# Patient Record
Sex: Female | Born: 1970 | Race: White | Hispanic: No | Marital: Married | Smoking: Never smoker
Health system: Southern US, Community
[De-identification: ages and names within clinical notes are randomized; demographics above are authoritative.]

## PROBLEM LIST (undated history)

## (undated) DIAGNOSIS — R079 Chest pain, unspecified: Secondary | ICD-10-CM

## (undated) DIAGNOSIS — R011 Cardiac murmur, unspecified: Secondary | ICD-10-CM

## (undated) DIAGNOSIS — B019 Varicella without complication: Secondary | ICD-10-CM

## (undated) DIAGNOSIS — E079 Disorder of thyroid, unspecified: Secondary | ICD-10-CM

## (undated) DIAGNOSIS — I10 Essential (primary) hypertension: Secondary | ICD-10-CM

## (undated) DIAGNOSIS — G43909 Migraine, unspecified, not intractable, without status migrainosus: Secondary | ICD-10-CM

## (undated) DIAGNOSIS — D649 Anemia, unspecified: Secondary | ICD-10-CM

## (undated) HISTORY — DX: Essential (primary) hypertension: I10

## (undated) HISTORY — DX: Chest pain, unspecified: R07.9

## (undated) HISTORY — DX: Disorder of thyroid, unspecified: E07.9

## (undated) HISTORY — DX: Cardiac murmur, unspecified: R01.1

## (undated) HISTORY — DX: Varicella without complication: B01.9

## (undated) HISTORY — DX: Migraine, unspecified, not intractable, without status migrainosus: G43.909

## (undated) HISTORY — DX: Anemia, unspecified: D64.9

---

## 1990-06-20 HISTORY — PX: CERVIX SURGERY: SHX593

## 2004-06-20 HISTORY — PX: CLAVICLE SURGERY: SHX598

## 2012-06-20 HISTORY — PX: DILATION AND CURETTAGE OF UTERUS: SHX78

## 2016-08-20 ENCOUNTER — Emergency Department (HOSPITAL_COMMUNITY)
Admission: EM | Admit: 2016-08-20 | Discharge: 2016-08-20 | Disposition: A | Discharge status: Home / Self Care | Payer: 59 | Attending: Emergency Medicine | Admitting: Emergency Medicine

## 2016-08-20 ENCOUNTER — Encounter (HOSPITAL_COMMUNITY): Payer: Self-pay | Admitting: *Deleted

## 2016-08-20 DIAGNOSIS — R109 Unspecified abdominal pain: Secondary | ICD-10-CM | POA: Diagnosis not present

## 2016-08-20 MED ORDER — METHOCARBAMOL 500 MG PO TABS: 500.0000 mg | ORAL_TABLET | Freq: Three times a day (TID) | ORAL | 0 refills | Status: DC | PRN

## 2016-08-20 NOTE — Discharge Instructions (Signed)
Your pain in likely in your chest wall. Take the muscle relaxers as needed.

## 2016-08-20 NOTE — ED Provider Notes (Signed)
[facility] DEPT Provider Note   CSN: [id number] Arrival date & time: 08/20/16  1314     History   Chief Complaint Chief Complaint  Patient presents with  . Back Pain    HPI Theresa Raymond is a 46 y.o. female.  HPI Patient sent from urgent care with left lower rib/back pain. Has had for last 2 days. No trauma. Worse with movements. Had urine at the urgent care that did not show infection or blood. Chest x-ray also did not show clear cause. No shortness of breath. No fevers. Sent in to the ER because the provider at the urgent care could palpate her aorta.   History reviewed. No pertinent past medical history.  There are no active problems to display for this patient.   Past Surgical History:  Procedure Laterality Date  . CLAVICLE SURGERY      OB History    No data available       Home Medications    Prior to Admission medications   Medication Sig Start Date End Date Taking? Authorizing Provider  methocarbamol (ROBAXIN) 500 MG tablet Take 1 tablet (500 mg total) by mouth every 8 (eight) hours as needed for muscle spasms. 08/20/16   Davonna Belling, MD    Family History No family history on file.  Social History Social History  Substance Use Topics  . Smoking status: Never Smoker  . Smokeless tobacco: Never Used  . Alcohol use 8.4 oz/week    14 Glasses of wine per week     Allergies   Patient has no allergy information on record.   Review of Systems Review of Systems  Constitutional: Negative for appetite change.  Respiratory: Negative for shortness of breath.   Cardiovascular: Negative for chest pain.  Gastrointestinal: Negative for abdominal pain.  Endocrine: Negative for polyuria.  Genitourinary: Positive for flank pain. Negative for dyspareunia and pelvic pain.  Musculoskeletal: Negative for back pain.  Neurological: Negative for seizures, speech difficulty and light-headedness.  Psychiatric/Behavioral: Negative for confusion.      Physical Exam Updated Vital Signs BP 168/93   Pulse 63   Temp 97.5 F (36.4 C) (Oral)   Resp 10   Ht 5\' 3"  (1.6 m)   Wt 132 lb (59.9 kg)   LMP 07/26/2016 (Exact Date)   SpO2 100%   BMI 23.38 kg/m   Physical Exam  Constitutional: She appears well-developed.  HENT:  Head: Atraumatic.  Eyes: EOM are normal.  Neck: Neck supple.  Cardiovascular: Normal rate.   Pulmonary/Chest: Effort normal. She exhibits tenderness.  Tenderness over left lateral lower ribs. No rash. No crepitance or deformity. Lungs are without wheezes rales or rhonchi.  Abdominal: Soft. There is no tenderness.  Palpable abdominal aorta  Musculoskeletal: She exhibits no edema.  Neurological: She is alert.  Skin: Skin is warm. Capillary refill takes less than 2 seconds.     ED Treatments / Results  Labs (all labs ordered are listed, but only abnormal results are displayed) Labs Reviewed - No data to display  EKG  EKG Interpretation None       Radiology No results found.  Procedures Procedures (including critical care time)  Medications Ordered in ED Medications - No data to display   Initial Impression / Assessment and Plan / ED Course  I have reviewed the triage vital signs and the nursing notes.  Pertinent labs & imaging results that were available during my care of the patient were reviewed by me and considered in my medical decision making (  see chart for details).     Patient with left lower rib pain. Sent in from urgent care to rule out aortic aneurysm. Patient is overall low risk. Does not history of hypertension. Bedside ultrasound reviewed with patient. Abdominal aorta imaged along its whole extent down to the bifurcation without dilation. Discharge home. Labs and x-ray from urgent care.. Will give muscle relaxer.  Final Clinical Impressions(s) / ED Diagnoses   Final diagnoses:  Flank pain    New Prescriptions Discharge Medication List as of 08/20/2016  3:31 PM    START  taking these medications   Details  methocarbamol (ROBAXIN) 500 MG tablet Take 1 tablet (500 mg total) by mouth every 8 (eight) hours as needed for muscle spasms., Starting Sat 08/20/2016, Print         Davonna Belling, MD 08/20/16 [phone]

## 2016-08-20 NOTE — ED Triage Notes (Signed)
Pt sent from UC d/t L mid back pain onset x 2 days, xray completed & pt reports MD could palpate her aorta & was sent here for further eval, denies trauma to the back Denies CP & SOB, A&O x4

## 2016-08-20 NOTE — ED Notes (Signed)
EDP at bedside with US.  

## 2018-03-01 ENCOUNTER — Encounter: Payer: Self-pay | Admitting: Physician Assistant

## 2018-03-01 ENCOUNTER — Other Ambulatory Visit: Payer: Self-pay

## 2018-03-01 ENCOUNTER — Ambulatory Visit: Payer: PRIVATE HEALTH INSURANCE | Admitting: Physician Assistant

## 2018-03-01 VITALS — BP 138/86 | HR 70 | Temp 98.2°F | Resp 16 | Ht 63.75 in | Wt 135.6 lb

## 2018-03-01 DIAGNOSIS — E039 Hypothyroidism, unspecified: Secondary | ICD-10-CM | POA: Insufficient documentation

## 2018-03-01 DIAGNOSIS — Z0000 Encounter for general adult medical examination without abnormal findings: Secondary | ICD-10-CM | POA: Diagnosis not present

## 2018-03-01 LAB — COMPREHENSIVE METABOLIC PANEL
ALBUMIN: 4.6 g/dL (ref 3.5–5.2)
ALT: 13 U/L (ref 0–35)
AST: 26 U/L (ref 0–37)
Alkaline Phosphatase: 33 U/L — ABNORMAL LOW (ref 39–117)
BUN: 18 mg/dL (ref 6–23)
CALCIUM: 9.4 mg/dL (ref 8.4–10.5)
CHLORIDE: 101 meq/L (ref 96–112)
CO2: 28 mEq/L (ref 19–32)
Creatinine, Ser: 0.76 mg/dL (ref 0.40–1.20)
GFR: 85.76 mL/min (ref >60.00)
Glucose, Bld: 99 mg/dL (ref 70–99)
Potassium: 4.2 mEq/L (ref 3.5–5.1)
Sodium: 138 mEq/L (ref 135–145)
Total Bilirubin: 0.6 mg/dL (ref 0.2–1.2)
Total Protein: 7 g/dL (ref 6.0–8.3)

## 2018-03-01 LAB — CBC WITH DIFFERENTIAL/PLATELET
BASOS PCT: 1 % (ref 0.0–3.0)
Basophils Absolute: 0 10*3/uL (ref 0.0–0.1)
EOS ABS: 0.1 10*3/uL (ref 0.0–0.7)
EOS PCT: 1.8 % (ref 0.0–5.0)
HEMATOCRIT: 39.1 % (ref 36.0–46.0)
Hemoglobin: 13.5 g/dL (ref 12.0–15.0)
LYMPHS PCT: 28 % (ref 12.0–46.0)
Lymphs Abs: 1.3 10*3/uL (ref 0.7–4.0)
MCHC: 34.6 g/dL (ref 30.0–36.0)
MCV: 94.1 fl (ref 78.0–100.0)
MONOS PCT: 7.2 % (ref 3.0–12.0)
Monocytes Absolute: 0.3 10*3/uL (ref 0.1–1.0)
NEUTROS ABS: 2.9 10*3/uL (ref 1.4–7.7)
Neutrophils Relative %: 62 % (ref 43.0–77.0)
PLATELETS: 251 10*3/uL (ref 150.0–400.0)
RBC: 4.15 Mil/uL (ref 3.87–5.11)
RDW: 13.5 % (ref 11.5–15.5)
WBC: 4.7 10*3/uL (ref 4.0–10.5)

## 2018-03-01 LAB — TSH: TSH: 0.79 u[IU]/mL (ref 0.35–4.50)

## 2018-03-01 LAB — LIPID PANEL
CHOL/HDL RATIO: 2
Cholesterol: 181 mg/dL (ref 0–200)
HDL: 92.7 mg/dL (ref >39.00)
LDL CALC: 78 mg/dL (ref 0–99)
NONHDL: 88.79
Triglycerides: 52 mg/dL (ref 0.0–149.0)
VLDL: 10.4 mg/dL (ref 0.0–40.0)

## 2018-03-01 LAB — HEMOGLOBIN A1C: HEMOGLOBIN A1C: 5.2 % (ref 4.6–6.5)

## 2018-03-01 NOTE — Assessment & Plan Note (Signed)
Depression screen negative. Health Maintenance reviewed. Preventive schedule discussed and handout given in AVS. Will obtain fasting labs today.  

## 2018-03-01 NOTE — Patient Instructions (Signed)
-Please go to the lab for blood work.  -Our office will call you with your results unless you have chosen to receive results via MyChart. -If your blood work is normal we will follow-up each year for physicals and as scheduled for chronic medical problems. -If anything is abnormal we will treat accordingly and get you in for a follow-up.  Welcome to [facility]! It was nice meeting you!  Please schedule your PAP at your convenience once menstrual period has ended.   Preventive Care 40-64 Years, Female Preventive care refers to lifestyle choices and visits with your health care provider that can promote health and wellness. What does preventive care include?  A yearly physical exam. This is also called an annual well check.  Dental exams once or twice a year.  Routine eye exams. Ask your health care provider how often you should have your eyes checked.  Personal lifestyle choices, including: ? Daily care of your teeth and gums. ? Regular physical activity. ? Eating a healthy diet. ? Avoiding tobacco and drug use. ? Limiting alcohol use. ? Practicing safe sex. ? Taking low-dose aspirin daily starting at age 62. ? Taking vitamin and mineral supplements as recommended by your health care provider. What happens during an annual well check? The services and screenings done by your health care provider during your annual well check will depend on your age, overall health, lifestyle risk factors, and family history of disease. Counseling Your health care provider may ask you questions about your:  Alcohol use.  Tobacco use.  Drug use.  Emotional well-being.  Home and relationship well-being.  Sexual activity.  Eating habits.  Work and work Statistician.  Method of birth control.  Menstrual cycle.  Pregnancy history.  Screening You may have the following tests or measurements:  Height, weight, and BMI.  Blood pressure.  Lipid and cholesterol levels. These may be  checked every 5 years, or more frequently if you are over 9 years old.  Skin check.  Lung cancer screening. You may have this screening every year starting at age 55 if you have a 30-pack-year history of smoking and currently smoke or have quit within the past 15 years.  Fecal occult blood test (FOBT) of the stool. You may have this test every year starting at age 47.  Flexible sigmoidoscopy or colonoscopy. You may have a sigmoidoscopy every 5 years or a colonoscopy every 10 years starting at age 78.  Hepatitis C blood test.  Hepatitis B blood test.  Sexually transmitted disease (STD) testing.  Diabetes screening. This is done by checking your blood sugar (glucose) after you have not eaten for a while (fasting). You may have this done every 1-3 years.  Mammogram. This may be done every 1-2 years. Talk to your health care provider about when you should start having regular mammograms. This may depend on whether you have a family history of breast cancer.  BRCA-related cancer screening. This may be done if you have a family history of breast, ovarian, tubal, or peritoneal cancers.  Pelvic exam and Pap test. This may be done every 3 years starting at age 52. Starting at age 69, this may be done every 5 years if you have a Pap test in combination with an HPV test.  Bone density scan. This is done to screen for osteoporosis. You may have this scan if you are at high risk for osteoporosis.  Discuss your test results, treatment options, and if necessary, the need for more tests with  your health care provider. Vaccines Your health care provider may recommend certain vaccines, such as:  Influenza vaccine. This is recommended every year.  Tetanus, diphtheria, and acellular pertussis (Tdap, Td) vaccine. You may need a Td booster every 10 years.  Varicella vaccine. You may need this if you have not been vaccinated.  Zoster vaccine. You may need this after age 63.  Measles, mumps, and  rubella (MMR) vaccine. You may need at least one dose of MMR if you were born in 1957 or later. You may also need a second dose.  Pneumococcal 13-valent conjugate (PCV13) vaccine. You may need this if you have certain conditions and were not previously vaccinated.  Pneumococcal polysaccharide (PPSV23) vaccine. You may need one or two doses if you smoke cigarettes or if you have certain conditions.  Meningococcal vaccine. You may need this if you have certain conditions.  Hepatitis A vaccine. You may need this if you have certain conditions or if you travel or work in places where you may be exposed to hepatitis A.  Hepatitis B vaccine. You may need this if you have certain conditions or if you travel or work in places where you may be exposed to hepatitis B.  Haemophilus influenzae type b (Hib) vaccine. You may need this if you have certain conditions.  Talk to your health care provider about which screenings and vaccines you need and how often you need them. This information is not intended to replace advice given to you by your health care provider. Make sure you discuss any questions you have with your health care provider. Document Released: 07/03/2015 Document Revised: 02/24/2016 Document Reviewed: 04/07/2015 Elsevier Interactive Patient Education  [employer].

## 2018-03-01 NOTE — Progress Notes (Signed)
Patient presents to clinic today to establish care. Patient is requesting CPE today. Is fasting for labs.   Chronic Issues: Hypothyroidism -- Started after birth of second child (21 years ago). Has been on a regimen of levothyroxine, taking 100 mcg dose for 3 years. No TSH level in > 1 year. Denies change in energy levels, cold nature.   Health Maintenance: Immunizations -- Declines flu shot today. Tetanus is up-to-date.  Mammogram -- Up-to-date per patient. Due at 79. PAP -- Due. Is currently on menstrual period.  Past Medical History:  Diagnosis Date  . Chicken pox   . Migraines   . Thyroid disease     Past Surgical History:  Procedure Laterality Date  . CERVIX SURGERY  1992  . CLAVICLE SURGERY  2006    Current Outpatient Medications on File Prior to Visit  Medication Sig Dispense Refill  . levothyroxine (SYNTHROID, LEVOTHROID) 100 MCG tablet Take 100 mcg by mouth daily before breakfast.    . Multiple Vitamins-Minerals (MULTIVITAMIN ADULT PO) Take 1 tablet by mouth daily.     No current facility-administered medications on file prior to visit.     Allergies  Allergen Reactions  . Other     Tree nuts, causes tight throat    Family History  Problem Relation Age of Onset  . Depression Mother   . Diabetes Mother   . Miscarriages / [place] Mother   . Hearing loss Father   . Stroke Father   . Cancer Maternal Grandfather   . Hyperlipidemia Maternal Grandfather   . Stroke Paternal Grandmother   . Early death Paternal Grandfather        Polio    Social History   Socioeconomic History  . Marital status: Married    Spouse name: Not on file  . Number of children: Not on file  . Years of education: Not on file  . Highest education level: Not on file  Occupational History  . Not on file  Social Needs  . Financial resource strain: Not on file  . Food insecurity:    Worry: Not on file    Inability: Not on file  . Transportation needs:    Medical: Not on  file    Non-medical: Not on file  Tobacco Use  . Smoking status: Never Smoker  . Smokeless tobacco: Never Used  Substance and Sexual Activity  . Alcohol use: Yes    Alcohol/week: 15.0 standard drinks    Types: 14 Glasses of wine, 1 Cans of beer per week    Comment: Has wine daily, occasional beer and liquor  . Drug use: No  . Sexual activity: Yes  Lifestyle  . Physical activity:    Days per week: Not on file    Minutes per session: Not on file  . Stress: Not on file  Relationships  . Social connections:    Talks on phone: Not on file    Gets together: Not on file    Attends religious service: Not on file    Active member of club or organization: Not on file    Attends meetings of clubs or organizations: Not on file    Relationship status: Not on file  . Intimate partner violence:    Fear of current or ex partner: Not on file    Emotionally abused: Not on file    Physically abused: Not on file    Forced sexual activity: Not on file  Other Topics Concern  . Not on file  Social History Narrative  . Not on file   Review of Systems  Constitutional: Negative for fever and weight loss.  HENT: Negative for ear discharge, ear pain, hearing loss and tinnitus.   Eyes: Negative for blurred vision, double vision, photophobia and pain.  Respiratory: Negative for cough and shortness of breath.   Cardiovascular: Negative for chest pain and palpitations.  Gastrointestinal: Negative for abdominal pain, blood in stool, constipation, diarrhea, heartburn, melena, nausea and vomiting.  Genitourinary: Negative for dysuria, flank pain, frequency, hematuria and urgency.  Musculoskeletal: Negative for falls.  Neurological: Negative for dizziness, loss of consciousness and headaches.  Endo/Heme/Allergies: Negative for environmental allergies.  Psychiatric/Behavioral: Negative for depression, hallucinations, substance abuse and suicidal ideas. The patient is not nervous/anxious and does not have  insomnia.    BP 138/86   Pulse 70   Temp 98.2 F (36.8 C) (Oral)   Resp 16   Ht 5' 3.75" (1.619 m)   Wt 135 lb 9.6 oz (61.5 kg)   LMP 02/25/2018   SpO2 98%   BMI 23.46 kg/m   Physical Exam  Constitutional: She is oriented to person, place, and time.  HENT:  Head: Normocephalic and atraumatic.  Right Ear: Tympanic membrane, external ear and ear canal normal.  Left Ear: Tympanic membrane, external ear and ear canal normal.  Nose: Nose normal. No mucosal edema.  Mouth/Throat: Uvula is midline, oropharynx is clear and moist and mucous membranes are normal. No oropharyngeal exudate or posterior oropharyngeal erythema.  Eyes: Pupils are equal, round, and reactive to light. Conjunctivae are normal.  Neck: Neck supple. No thyromegaly present.  Cardiovascular: Normal rate, regular rhythm, normal heart sounds and intact distal pulses.  Pulmonary/Chest: Effort normal and breath sounds normal. No respiratory distress. She has no wheezes. She has no rales.  Abdominal: Soft. Bowel sounds are normal. She exhibits no distension and no mass. There is no tenderness. There is no rebound and no guarding.  Lymphadenopathy:    She has no cervical adenopathy.  Neurological: She is alert and oriented to person, place, and time. No cranial nerve deficit.  Skin: Skin is warm and dry. No rash noted.  Vitals reviewed.  Assessment/Plan: Visit for preventive health examination Depression screen negative. Health Maintenance reviewed. Preventive schedule discussed and handout given in AVS. Will obtain fasting labs today.   Hypothyroidism Recheck TSH level today. Will refill medication once we make sure this is stable.     Leeanne Rio, PA-C

## 2018-03-01 NOTE — Assessment & Plan Note (Signed)
Recheck TSH level today. Will refill medication once we make sure this is stable.

## 2018-03-02 ENCOUNTER — Other Ambulatory Visit: Payer: Self-pay | Admitting: Physician Assistant

## 2018-03-02 MED ORDER — LEVOTHYROXINE SODIUM 100 MCG PO TABS: 100.0000 ug | ORAL_TABLET | Freq: Every day | ORAL | 1 refills | Status: DC

## 2018-03-06 ENCOUNTER — Encounter: Payer: Self-pay | Admitting: Physician Assistant

## 2018-03-06 ENCOUNTER — Other Ambulatory Visit (HOSPITAL_COMMUNITY)
Admission: RE | Admit: 2018-03-06 | Discharge: 2018-03-06 | Disposition: A | Discharge status: Home / Self Care | Payer: No Typology Code available for payment source | Source: Ambulatory Visit | Attending: Physician Assistant | Admitting: Physician Assistant

## 2018-03-06 ENCOUNTER — Other Ambulatory Visit: Payer: Self-pay

## 2018-03-06 ENCOUNTER — Ambulatory Visit (INDEPENDENT_AMBULATORY_CARE_PROVIDER_SITE_OTHER): Payer: PRIVATE HEALTH INSURANCE | Admitting: Physician Assistant

## 2018-03-06 VITALS — BP 124/70 | HR 74 | Temp 97.8°F | Resp 14 | Ht 64.0 in | Wt 137.0 lb

## 2018-03-06 DIAGNOSIS — Z124 Encounter for screening for malignant neoplasm of cervix: Secondary | ICD-10-CM | POA: Diagnosis present

## 2018-03-06 NOTE — Patient Instructions (Signed)

## 2018-03-06 NOTE — Progress Notes (Signed)
Subjective:     Kristiana Jacko is a 47 y.o. woman who comes in today for a  pap smear only. Her most recent annual exam was on 03/01/18. Her most recent Pap smear was 3 years ago and showed no abnormalities. Previous abnormal Pap smears: yes - s/p conization in 1992. No abnormal PAP since then.  The following portions of the patient's history were reviewed and updated as appropriate: allergies, current medications, past family history, past medical history, past social history, past surgical history and problem list.  Review of Systems Pertinent items are noted in HPI.   Objective:    LMP 02/25/2018  Pelvic Exam: cervix normal in appearance, external genitalia normal, no adnexal masses or tenderness, no bladder tenderness, no cervical motion tenderness and vagina normal without discharge. Pap smear obtained.   Assessment:    Screening pap smear.   Plan:    Follow up in 3 years in regards to routine PAP smear, or as indicated by Pap results.

## 2018-03-07 LAB — CYTOLOGY - PAP
Diagnosis: NEGATIVE
HPV: NOT DETECTED

## 2018-06-04 ENCOUNTER — Telehealth: Payer: Self-pay | Admitting: Emergency Medicine

## 2018-06-04 NOTE — Telephone Encounter (Signed)
[place] for pt to receive these vaccinations

## 2018-06-04 NOTE — Telephone Encounter (Signed)
She has been scheduled for Wednesday.

## 2018-06-04 NOTE — Telephone Encounter (Signed)
Ok to receive. Please call and schedule patient for nurse visit.

## 2018-06-04 NOTE — Telephone Encounter (Signed)
Subject: Appointment Request (HM)               Appointment Request From: [name]    With Provider: Caralyn Guile [facility]]    Preferred Date Range: From 06/06/2018 To 06/06/2018    Preferred Times: Any    Reason: To address the following health maintenance concerns.  Tetanus/Tdap  Influenza Vaccine    Comments:  any time works for me on wednesday

## 2018-06-06 ENCOUNTER — Ambulatory Visit (INDEPENDENT_AMBULATORY_CARE_PROVIDER_SITE_OTHER): Payer: PRIVATE HEALTH INSURANCE | Admitting: *Deleted

## 2018-06-06 DIAGNOSIS — Z23 Encounter for immunization: Secondary | ICD-10-CM

## 2018-06-07 NOTE — Progress Notes (Signed)
Theresa Raymond is a 47 y.o. female presents to the office today for Tdap, Flu   injections, per physician's orders. Tdap (med), 0.19mL (dose),  IM (route) was administered left deltoid (location) today. Influenza 0.74mL administered right deltoid. Patient tolerated injection.  [name]

## 2018-09-02 ENCOUNTER — Other Ambulatory Visit: Payer: Self-pay | Admitting: Physician Assistant

## 2018-09-10 ENCOUNTER — Telehealth: Payer: PRIVATE HEALTH INSURANCE | Admitting: Physician Assistant

## 2018-09-10 ENCOUNTER — Encounter (HOSPITAL_COMMUNITY): Payer: Self-pay | Admitting: Emergency Medicine

## 2018-09-10 ENCOUNTER — Other Ambulatory Visit: Payer: Self-pay

## 2018-09-10 ENCOUNTER — Emergency Department (HOSPITAL_COMMUNITY): Payer: Managed Care, Other (non HMO)

## 2018-09-10 ENCOUNTER — Encounter: Payer: Self-pay | Admitting: Physician Assistant

## 2018-09-10 ENCOUNTER — Emergency Department (HOSPITAL_COMMUNITY)
Admission: EM | Admit: 2018-09-10 | Discharge: 2018-09-10 | Disposition: A | Discharge status: Home / Self Care | Payer: Managed Care, Other (non HMO) | Attending: Emergency Medicine | Admitting: Emergency Medicine

## 2018-09-10 DIAGNOSIS — R05 Cough: Secondary | ICD-10-CM

## 2018-09-10 DIAGNOSIS — R0789 Other chest pain: Secondary | ICD-10-CM | POA: Insufficient documentation

## 2018-09-10 DIAGNOSIS — I10 Essential (primary) hypertension: Secondary | ICD-10-CM | POA: Insufficient documentation

## 2018-09-10 DIAGNOSIS — R079 Chest pain, unspecified: Secondary | ICD-10-CM

## 2018-09-10 DIAGNOSIS — E039 Hypothyroidism, unspecified: Secondary | ICD-10-CM | POA: Insufficient documentation

## 2018-09-10 DIAGNOSIS — Z79899 Other long term (current) drug therapy: Secondary | ICD-10-CM | POA: Diagnosis not present

## 2018-09-10 DIAGNOSIS — R059 Cough, unspecified: Secondary | ICD-10-CM

## 2018-09-10 DIAGNOSIS — R0602 Shortness of breath: Secondary | ICD-10-CM

## 2018-09-10 LAB — TSH: TSH: 2.855 u[IU]/mL (ref 0.350–4.500)

## 2018-09-10 LAB — I-STAT BETA HCG BLOOD, ED (MC, WL, AP ONLY): I-stat hCG, quantitative: <5 m[IU]/mL (ref <5)

## 2018-09-10 LAB — BASIC METABOLIC PANEL
Anion gap: 9 (ref 5–15)
BUN: 17 mg/dL (ref 6–20)
CO2: 26 mmol/L (ref 22–32)
Calcium: 9.2 mg/dL (ref 8.9–10.3)
Chloride: 103 mmol/L (ref 98–111)
Creatinine, Ser: 0.8 mg/dL (ref 0.44–1.00)
GFR calc Af Amer: >60 mL/min (ref >60)
GFR calc non Af Amer: >60 mL/min (ref >60)
Glucose, Bld: 122 mg/dL — ABNORMAL HIGH (ref 70–99)
Potassium: 3.7 mmol/L (ref 3.5–5.1)
Sodium: 138 mmol/L (ref 135–145)

## 2018-09-10 LAB — CBC
HCT: 40.6 % (ref 36.0–46.0)
Hemoglobin: 13.6 g/dL (ref 12.0–15.0)
MCH: 31.5 pg (ref 26.0–34.0)
MCHC: 33.5 g/dL (ref 30.0–36.0)
MCV: 94 fL (ref 80.0–100.0)
Platelets: 269 10*3/uL (ref 150–400)
RBC: 4.32 MIL/uL (ref 3.87–5.11)
RDW: 12.5 % (ref 11.5–15.5)
WBC: 5.8 10*3/uL (ref 4.0–10.5)
nRBC: 0 % (ref 0.0–0.2)

## 2018-09-10 LAB — I-STAT TROPONIN, ED
TROPONIN I, POC: 0.04 ng/mL (ref 0.00–0.08)
Troponin i, poc: 0.03 ng/mL (ref 0.00–0.08)

## 2018-09-10 LAB — D-DIMER, QUANTITATIVE (NOT AT ARMC): D DIMER QUANT: 0.38 ug{FEU}/mL (ref 0.00–0.50)

## 2018-09-10 MED ORDER — SODIUM CHLORIDE 0.9% FLUSH
3.0000 mL | Freq: Once | INTRAVENOUS | Status: AC
  Administered 2018-09-10: 3 mL via INTRAVENOUS

## 2018-09-10 MED ORDER — IOHEXOL 350 MG/ML SOLN
100.0000 mL | Freq: Once | INTRAVENOUS | Status: AC | PRN
  Administered 2018-09-10: 100 mL via INTRAVENOUS

## 2018-09-10 MED ORDER — AMLODIPINE BESYLATE 5 MG PO TABS: 5.0000 mg | ORAL_TABLET | Freq: Every day | ORAL | 0 refills | Status: DC

## 2018-09-10 MED ORDER — BENZONATATE 100 MG PO CAPS: 100.0000 mg | ORAL_CAPSULE | Freq: Three times a day (TID) | ORAL | 0 refills | Status: DC | PRN

## 2018-09-10 MED ORDER — ALBUTEROL SULFATE HFA 108 (90 BASE) MCG/ACT IN AERS: 2.0000 | INHALATION_SPRAY | RESPIRATORY_TRACT | 0 refills | Status: DC | PRN

## 2018-09-10 NOTE — ED Notes (Signed)
Patient verbalizes understanding of discharge instructions. Opportunity for questioning and answers were provided. Armband removed by staff, pt discharged from ED ambulatory.   

## 2018-09-10 NOTE — ED Notes (Signed)
Pt has sudden onset return of pain while at rest.  Quickly relieved without interventions.  No SOB or nausea.

## 2018-09-10 NOTE — Progress Notes (Signed)
E-Visit for Corona Virus Screening  Based on your current symptoms, you may very well have the virus, however your symptoms are mild. Currently, not all patients are being tested. If the symptoms are mild and there is not a known exposure, performing the test is not indicated.  Coronavirus disease 2019 (COVID-19) is a respiratory illness that can spread from person to person. The virus that causes COVID-19 is a new virus that was first identified in the country of [place] but is now found in multiple other countries and has spread to the [place].  Symptoms associated with the virus are mild to severe fever, cough, and shortness of breath. There is currently no vaccine to protect against COVID-19, and there is no specific antiviral treatment for the virus.   To be considered HIGH RISK for Coronavirus (COVID-19), you have to meet the following criteria:  . Traveled to [place], [place], [place], [place] or [place]; or in the [place] to [place], [place], [place], or [place]; and have fever, cough, and shortness of breath within the last 2 weeks of travel OR  . Been in close contact with a person diagnosed with COVID-19 within the last 2 weeks and have fever, cough, and shortness of breath  . IF YOU DO NOT MEET THESE CRITERIA, YOU ARE CONSIDERED LOW RISK FOR COVID-19.   It is vitally important that if you feel that you have an infection such as this virus or any other virus that you stay home and away from places where you may spread it to others.  You should self-quarantine for 14 days if you have symptoms that could potentially be coronavirus and avoid contact with people age 83 and older.   You can use medication such as A prescription cough medication called Tessalon Perles 100 mg. You may take 1-2 capsules every 8 hours as needed for cough and A prescription inhaler called Albuterol MDI 90 mcg /actuation 2 puffs every 4 hours as needed for shortness of breath, wheezing, cough  You may  also take acetaminophen (Tylenol) as needed for fever.   Reduce your risk of any infection by using the same precautions used for avoiding the common cold or flu:  Marland Kitchen Wash your hands often with soap and warm water for at least 20 seconds.  If soap and water are not readily available, use an alcohol-based hand sanitizer with at least 60% alcohol.  . If coughing or sneezing, cover your mouth and nose by coughing or sneezing into the elbow areas of your shirt or coat, into a tissue or into your sleeve (not your hands). . Avoid shaking hands with others and consider head nods or verbal greetings only. . Avoid touching your eyes, nose, or mouth with unwashed hands.  . Avoid close contact with people who are sick. . Avoid places or events with large numbers of people in one location, like concerts or sporting events. . Carefully consider travel plans you have or are making. . If you are planning any travel outside or inside the [place], visit the CDC's Travelers' Health webpage for the latest health notices. . If you have some symptoms but not all symptoms, continue to monitor at home and seek medical attention if your symptoms worsen. . If you are having a medical emergency, call 911.  HOME CARE . Only take medications as instructed by your medical team. . Drink plenty of fluids and get plenty of rest. . A steam or ultrasonic humidifier can help if you  have congestion.   GET HELP RIGHT AWAY IF: . You develop worsening fever. . You become short of breath . You cough up blood. . Your symptoms become more severe MAKE SURE YOU   Understand these instructions.  Will watch your condition.  Will get help right away if you are not doing well or get worse.  Your e-visit answers were reviewed by a board certified advanced clinical practitioner to complete your personal care plan.  Depending on the condition, your plan could have included both over the counter or prescription medications.  If there is a  problem please reply once you have received a response from your provider. Your safety is important to [place].  If you have drug allergies check your prescription carefully.    You can use MyChart to ask questions about today's visit, request a non-urgent call back, or ask for a work or school excuse for 24 hours related to this e-Visit. If it has been greater than 24 hours you will need to follow up with your provider, or enter a new e-Visit to address those concerns. You will get an e-mail in the next two days asking about your experience.  I hope that your e-visit has been valuable and will speed your recovery. Thank you for using e-visits.   I have spent 7 min in completion and review of this note- Lacy Duverney [facility]

## 2018-09-10 NOTE — ED Notes (Signed)
ED Provider at bedside. 

## 2018-09-10 NOTE — ED Notes (Signed)
Patient transported to CT 

## 2018-09-10 NOTE — Addendum Note (Signed)
Addended by: Waldon Merl on: 09/10/2018 11:31 AM   Modules accepted: Orders

## 2018-09-10 NOTE — ED Provider Notes (Signed)
[facility] EMERGENCY DEPARTMENT Provider Note   CSN: [id number] Arrival date & time: 09/10/18  1527    History   Chief Complaint Chief Complaint  Patient presents with   Chest Pain    HPI Theresa Raymond is a 48 y.o. female with a past medical history of hypothyroid who presents today for evaluation of chest pain.  She reports that for the past week she has had intermittent chest pain in the middle of her chest radiating through to her back.  She says the pain feels aching in nature.  The pain is intermittent, will be present for a maximum of 1 minute before going away.  During this time she feels like her heart is beating really quickly, gets nauseous, develops cold sweats and feels lightheaded.  She denies any fevers.  She reports that overall she is more short of breath than usual.  She has flown to [facility] as recently as 2 weeks ago.  She denies any leg swelling.  She notes that she is unable to do the things that she normally does due to her shortness of breath.  She says that she has currently been training for a marathon and runs 15 to 20 miles a week which she has been unable to do.  She reports dry cough for two days.   She reports that her father had a CABG before the age of 62.      HPI  Past Medical History:  Diagnosis Date   Chicken pox    Migraines    Thyroid disease     Patient Active Problem List   Diagnosis Date Noted   Visit for preventive health examination 03/01/2018   Hypothyroidism 03/01/2018    Past Surgical History:  Procedure Laterality Date   CERVIX SURGERY  1992   CLAVICLE SURGERY  2006     OB History   No obstetric history on file.      Home Medications    Prior to Admission medications   Medication Sig Start Date End Date Taking? Authorizing Provider  levothyroxine (SYNTHROID, LEVOTHROID) 100 MCG tablet TAKE 1 TABLET (100 MCG TOTAL) BY MOUTH DAILY BEFORE BREAKFAST. Patient taking differently: Take 100  mcg by mouth at bedtime.  09/03/18  Yes Brunetta Jeans, PA-C  Multiple Vitamins-Minerals (MULTIVITAMIN ADULT PO) Take 1 tablet by mouth daily.   Yes [provider]  amLODipine (NORVASC) 5 MG tablet Take 1 tablet (5 mg total) by mouth daily for 14 days. 09/10/18 09/24/18  Lorin Glass, PA-C    Family History Family History  Problem Relation Age of Onset   Depression Mother    Diabetes Mother    Miscarriages / [place] Mother    Hearing loss Father    Stroke Father    Cancer Maternal Grandfather    Hyperlipidemia Maternal Grandfather    Stroke Paternal Grandmother    Early death Paternal Grandfather        Polio    Social History Social History   Tobacco Use   Smoking status: Never Smoker   Smokeless tobacco: Never Used  Substance Use Topics   Alcohol use: Yes    Alcohol/week: 15.0 standard drinks    Types: 14 Glasses of wine, 1 Cans of beer per week    Comment: Has wine daily, occasional beer and liquor   Drug use: No     Allergies   Other   Review of Systems Review of Systems  Constitutional: Negative for chills and fever.  HENT: Negative for congestion.   Eyes: Negative for visual disturbance.  Respiratory: Positive for cough and shortness of breath.   Cardiovascular: Positive for chest pain and palpitations. Negative for leg swelling.  Gastrointestinal: Negative for abdominal pain, diarrhea and vomiting.  Genitourinary: Negative for dysuria.  Musculoskeletal: Positive for back pain. Negative for neck pain.  Skin: Negative for color change and rash.  Neurological: Negative for dizziness, weakness and numbness.  All other systems reviewed and are negative.    Physical Exam Updated Vital Signs BP (!) 182/92    Pulse 60    Temp 97.6 F (36.4 C) (Oral)    Resp 13    SpO2 100%   Physical Exam Vitals signs and nursing note reviewed.  Constitutional:      General: She is not in acute distress.    Appearance: She is  well-developed.  HENT:     Head: Normocephalic and atraumatic.  Eyes:     Conjunctiva/sclera: Conjunctivae normal.  Neck:     Musculoskeletal: Normal range of motion and neck supple.  Cardiovascular:     Rate and Rhythm: Normal rate and regular rhythm.     Pulses:          Radial pulses are 2+ on the right side and 2+ on the left side.       Dorsalis pedis pulses are 2+ on the right side and 2+ on the left side.       Posterior tibial pulses are 2+ on the right side and 2+ on the left side.     Heart sounds: Normal heart sounds. No murmur. No friction rub.  Pulmonary:     Effort: Pulmonary effort is normal. No respiratory distress.     Breath sounds: Normal breath sounds. No decreased breath sounds.  Abdominal:     General: Bowel sounds are normal.     Palpations: Abdomen is soft.     Tenderness: There is no abdominal tenderness.  Musculoskeletal:     Right lower leg: She exhibits no tenderness. No edema.     Left lower leg: She exhibits no tenderness. No edema.  Skin:    General: Skin is warm and dry.  Neurological:     General: No focal deficit present.     Mental Status: She is alert.  Psychiatric:        Mood and Affect: Mood normal.        Behavior: Behavior normal.      ED Treatments / Results  Labs (all labs ordered are listed, but only abnormal results are displayed) Labs Reviewed  BASIC METABOLIC PANEL - Abnormal; Notable for the following components:      Result Value   Glucose, Bld 122 (*)    All other components within normal limits  CBC  D-DIMER, QUANTITATIVE (NOT AT [facility])  TSH  I-STAT TROPONIN, ED  I-STAT BETA HCG BLOOD, ED ([facility], [facility], [facility] ONLY)  I-STAT TROPONIN, ED    EKG EKG Interpretation  Date/Time:  Monday September 10 2018 19:54:02 EDT Ventricular Rate:  67 PR Interval:  132 QRS Duration: 96 QT Interval:  427 QTC Calculation: 451 R Axis:   -46 Text Interpretation:  Sinus rhythm LAD, consider left anterior fascicular block No significant change  since last tracing Confirmed by Virgel Manifold [phone]) on 09/10/2018 7:58:58 PM   Radiology Dg Chest 2 View  Result Date: 09/10/2018 CLINICAL DATA:  Chest pain and shortness of breath EXAM: CHEST - 2 VIEW COMPARISON:  None. FINDINGS: Lungs are clear.  Heart size and pulmonary vascularity are normal. No adenopathy. No pneumothorax. Postoperative change noted right clavicle. There is lower thoracic levoscoliosis. IMPRESSION: No edema or consolidation. Electronically Signed   By: Lowella Grip III M.D.   On: 09/10/2018 15:59   Ct Angio Chest/abd/pel For Dissection W And/or W/wo  Result Date: 09/10/2018 CLINICAL DATA:  Chest and abdomen pain radiating to back. Shortness of breath EXAM: CT ANGIOGRAPHY CHEST, ABDOMEN AND PELVIS TECHNIQUE: Initially, axial CT images were obtained through the chest without intravenous contrast material administration. Multidetector CT imaging through the chest, abdomen and pelvis was performed using the standard protocol during bolus administration of intravenous contrast. Multiplanar reconstructed images and MIPs were obtained and reviewed to evaluate the vascular anatomy. CONTRAST:  117mL OMNIPAQUE IOHEXOL 350 MG/ML SOLN COMPARISON:  Chest radiograph August 12, 2018 FINDINGS: CTA CHEST FINDINGS Cardiovascular: There is no intramural hematoma within the thoracic aorta on noncontrast enhanced study. There is no thoracic aortic aneurysm or dissection. The visualized great vessels appear unremarkable. There is no appreciable pulmonary embolus. There is no pericardial effusion or pericardial thickening evident. Mediastinum/Nodes: Visualized thyroid appears unremarkable. There is no appreciable thoracic adenopathy. No esophageal lesions are appreciable. Lungs/Pleura: There is slight atelectatic change in the bases. There is no parenchymal lung edema or consolidation. No pneumothorax. No pleural effusion or pleural thickening evident. Musculoskeletal: There are no blastic or lytic  bone lesions. No chest wall lesions evident. Review of the MIP images confirms the above findings. CTA ABDOMEN AND PELVIS FINDINGS VASCULAR Aorta: There is no abdominal aortic aneurysm or dissection. No appreciable obstructive disease noted. No abnormal calcification. Celiac: Celiac artery and its branches appear widely patent. No aneurysm or dissection evident. SMA: Superior mesenteric artery and its branches are widely patent. No aneurysm or dissection. Renals: There is a single renal artery bilaterally. No evident aneurysm or dissection involving either renal artery or its branches. No evidence of fibromuscular dysplasia. No appreciable obstructive disease. IMA: The inferior mesenteric artery and its branches are patent. No aneurysm or dissection involving the inferior mesenteric artery or its branches. Inflow: The common iliac arteries are mildly tortuous without appreciable obstructive disease. There is no appreciable pelvic arterial obstructive disease. The visualized superficial femoral and profunda femoral arteries are widely patent. No aneurysm or dissection involving pelvic arterial vessels. Veins: No obvious venous abnormality within the limitations of this arterial phase study. Review of the MIP images confirms the above findings. NON-VASCULAR Hepatobiliary: No focal liver lesions are appreciable. Gallbladder wall is not appreciably thickened. There is no biliary duct dilatation. Pancreas: There is no pancreatic mass or inflammatory focus. Spleen: No splenic lesions are evident. Adrenals/Urinary Tract: Adrenals bilaterally appear unremarkable. Kidneys bilaterally show no evident mass or hydronephrosis on either side. There is no appreciable renal or ureteral calculus on either side. Urinary bladder is midline with wall thickness within normal limits. Stomach/Bowel: There is no appreciable bowel wall or mesenteric thickening. No evident bowel obstruction. There is no free air or portal venous air.  Lymphatic: There is no appreciable adenopathy in the abdomen or pelvis. Reproductive: Uterus is retroverted. No evident pelvic mass. There is a small amount of free fluid in the cul-de-sac, slightly more on the right than on the left. Other: Appendix appears normal. There is no abscess in the abdomen or pelvis. There is no ascites beyond mild fluid in the cul-de-sac region. Musculoskeletal: There is degenerative change at L5-S1. There are no blastic or lytic bone lesions. There is no intramuscular or abdominal wall lesion. Review  of the MIP images confirms the above findings. IMPRESSION: CT angiogram chest: 1. No thoracic aortic aneurysm or dissection. No evident pulmonary embolus. 2.  Slight bibasilar atelectasis.  No lung edema or consolidation. 3.  No appreciable thoracic adenopathy. CT angiogram abdomen; CT angiogram pelvis: 1. No aneurysm or dissection involving the aorta, major mesenteric, and major pelvic arterial vessels. No appreciable obstructive disease in these vessels. 2. Small amount of fluid in the cul-de-sac, likely indicative of recent ovarian cyst rupture. No evident pelvic mass. 3. No evident bowel obstruction. No abscess in the abdomen or pelvis. Appendix appears normal. 4. No evident renal or ureteral calculus. No hydronephrosis. Urinary bladder wall thickness within normal limits. Electronically Signed   By: Lowella Grip III M.D.   On: 09/10/2018 19:10    Procedures Procedures (including critical care time)  Medications Ordered in ED Medications  sodium chloride flush (NS) 0.9 % injection 3 mL (3 mLs Intravenous Given 09/10/18 1655)  iohexol (OMNIPAQUE) 350 MG/ML injection 100 mL (100 mLs Intravenous Contrast Given 09/10/18 1850)     Initial Impression / Assessment and Plan / ED Course  I have reviewed the triage vital signs and the nursing notes.  Pertinent labs & imaging results that were available during my care of the patient were reviewed by me and considered in my  medical decision making (see chart for details).       Patient presents today for evaluation of chest pain.  Her chest pain is intermittent, lasts under a minute and is associated with shortness of breath feeling lightheaded and nausea.  Chest x-ray was obtained without acute abnormality.  Troponin x2-.  D-dimer is not elevated.  TSH is 2.8 which is within normal limits.  She does not have any significant electrolyte or hematologic derangements.  EKG was obtained approximately 3 hours apart without change.  She was noted to be hypertensive while in the emergency room, so given both hypertension and chest pain, although atypical sounding, dissection study was ordered especially she feels like it radiates into her back.  CT dissection study did not show any evidence of acute abnormalities or dissection.  Given the atypical nature of her pain, including that it lasts for 10 to 60 seconds low suspicion for ACS.  Her chest x-ray and CT scan do not show evidence of pulmonary edema.  If her chest pain was caused by hypertensive emergency I would expect that her chest pain would be constant rather than fleeting in nature.  Given her low resting heart rate, she is started on p.o. amlodipine.  She has a primary care doctor and appears very reliable for follow-up.  She is given follow-up with cardiology, instructed to not run, as she is training for marathon, until she is cleared to do so by cardiology.  I recommended that she get her blood pressure checked in the next week.  She is instructed to return to the emergency room if her chest tightness becomes constant or changes in any way that is concerning to her.  This patient was discussed with Dr.Kohut   Return precautions were discussed with patient who states their understanding.  At the time of discharge patient denied any unaddressed complaints or concerns.  Patient is agreeable for discharge home.    Final Clinical Impressions(s) / ED Diagnoses   Final  diagnoses:  Atypical chest pain  Hypertension, unspecified type    ED Discharge Orders         Ordered    amLODipine (NORVASC) 5 MG  tablet  Daily     09/10/18 2014           Ollen Gross 09/10/18 2244    Virgel Manifold, MD 09/10/18 [phone]

## 2018-09-10 NOTE — ED Triage Notes (Signed)
Pt c/o intermittent chest pain x 1 week that radiates through her back. Shortness of breath developed in the last few days. Denies hx smoking.

## 2018-09-10 NOTE — Discharge Instructions (Signed)
Please stop running until you are able to follow-up with cardiology.  If your chest pain becomes constant, or you develop new or concerning symptoms please seek additional medical care and evaluation. Today your CT scan did not show any evidence of problems with the main artery coming off of your heart.  Your troponin, also known as a heart enzyme, was normal twice.  Your EKG, the electrical tracing of your heart, does show some changes however these do not appear to change while you are in the department.  Please limit your caffeine intake including soda, caffeine, tea, coffee, and other sources of caffeine.  Please call your primary care doctor for a recheck.  I would recommend getting your blood pressure checked this week.

## 2018-09-10 NOTE — Progress Notes (Signed)
Based on what you shared with me, I feel your condition warrants further evaluation and I recommend that you be seen for a face to face office visit.     NOTE: If you entered your credit card information for this eVisit, you will not be charged. You may see a "hold" on your card for the $35 but that hold will drop off and you will not have a charge processed.  If you are having a true medical emergency please call 911.  If you need an urgent face to face visit, [facility] has four urgent care centers for your convenience.    PLEASE NOTE: THE [facility] LOCATIONS AND URGENT CARE CLINICS DO NOT HAVE THE TESTING FOR CORONAVIRUS COVID19 AVAILABLE.  IF YOU FEEL YOU NEED THIS TEST YOU MUST HAVE AN ORDER TO GO TO A TESTING LOCATION FROM YOUR PROVIDER OR FROM A SCREENING E-VISIT     [email or web address] to reserve your spot online an avoid wait times  [facility] [address] [place], [place] [address] 8 am to 8 pm Monday-Friday 10 am to 4 pm Saturday-Sunday *Across the street from [employer]  [place], [address] 8 am to 5 pm Monday-Friday * In the [facility] on the [facility]   The following sites will take your insurance:  . [facility] [place] a Provider at this Location  [address] [place], [place] [address] . 10 am to 8 pm Monday-Friday . 12 pm to 8 pm Saturday-Sunday   . [facility] [place] a Provider at this Location  [address] [place], [place] [address] . 8 am to 8 pm Monday-Friday . 9 am to 6 pm Saturday . 11 am to 6 pm Sunday   . [facility] [place] Get Driving Directions  [address] [place], [place] [address] . 8 am to 8 pm Monday-Friday . 8 am to 4 pm Saturday-Sunday   Your  e-visit answers were reviewed by a board certified advanced clinical practitioner to complete your personal care plan.  Thank you for using e-Visits.  I have spent 7 min in completion and review of this note- [facility]

## 2018-09-10 NOTE — Progress Notes (Signed)
Theresa Raymond,  As per our phone converstation, you have chest that radiates to your back. The chest pain started after your cough, and is not related to the cough. The chest pain started one week after your plane travel. Due to this, I recommend getting a face to face evaluation exclude other causes of your symptoms such as blood Clot.

## 2018-09-11 ENCOUNTER — Telehealth: Payer: Self-pay | Admitting: Physician Assistant

## 2018-09-11 NOTE — Telephone Encounter (Signed)
Disregard message, spoke to Roslyn regarding pt.

## 2018-09-11 NOTE — Telephone Encounter (Signed)
Pt was advised by ER to see pcp for bp check, please advise on scheduling due to pt symptoms

## 2018-09-12 ENCOUNTER — Encounter: Payer: Self-pay | Admitting: Physician Assistant

## 2018-09-12 ENCOUNTER — Ambulatory Visit (INDEPENDENT_AMBULATORY_CARE_PROVIDER_SITE_OTHER): Payer: PRIVATE HEALTH INSURANCE | Admitting: Physician Assistant

## 2018-09-12 ENCOUNTER — Ambulatory Visit: Payer: PRIVATE HEALTH INSURANCE | Admitting: Physician Assistant

## 2018-09-12 VITALS — BP 150/90 | HR 61 | Temp 98.3°F | Resp 14 | Ht 64.0 in

## 2018-09-12 DIAGNOSIS — I10 Essential (primary) hypertension: Secondary | ICD-10-CM

## 2018-09-12 DIAGNOSIS — R9431 Abnormal electrocardiogram [ECG] [EKG]: Secondary | ICD-10-CM | POA: Diagnosis not present

## 2018-09-12 MED ORDER — VALSARTAN 40 MG PO TABS: 40.0000 mg | ORAL_TABLET | Freq: Every day | ORAL | 1 refills | Status: DC

## 2018-09-12 NOTE — Progress Notes (Signed)
Patient presents to clinic today for follow-up of hypertension. Patient was seen in the ER on 09/10/2018 for complaints of chest pain, fatigue. ER workup included routine labs (unremarkable), Troponin and d-dimer (unremarkable), EKG (NSR with LAD - no changes from prior tracings), CXR (unremarkable) and  CTA (Unremarkable). Patient was started on amlodipine 5 mg despite low heart rate and discharged home to follow-up with PCP and Cardiology.  Since discharge patient has noted an intermittent headache. Notes Home BP still elevated despite medication. Has felt more fatigued since starting medication (she has a low resting heart rate at baseline). Denies palpitations, LH or dizziness. Denies fever, chills. Is hydrating well and eating a low-salt diet. Has appt with Cardiology but is not for 2 months.   Past Medical History:  Diagnosis Date  . Chicken pox   . Migraines   . Thyroid disease     Current Outpatient Medications on File Prior to Visit  Medication Sig Dispense Refill  . amLODipine (NORVASC) 5 MG tablet Take 1 tablet (5 mg total) by mouth daily for 14 days. 14 tablet 0  . levothyroxine (SYNTHROID, LEVOTHROID) 100 MCG tablet TAKE 1 TABLET (100 MCG TOTAL) BY MOUTH DAILY BEFORE BREAKFAST. (Patient taking differently: Take 100 mcg by mouth at bedtime. ) 90 tablet 1  . Multiple Vitamins-Minerals (MULTIVITAMIN ADULT PO) Take 1 tablet by mouth daily.     No current facility-administered medications on file prior to visit.     Allergies  Allergen Reactions  . Other Swelling and Other (See Comments)    TREE NUTS make patient's throat "tight," but her breathing doesn't become impaired (I asked)    Family History  Problem Relation Age of Onset  . Depression Mother   . Diabetes Mother   . Miscarriages / [place] Mother   . Hearing loss Father   . Stroke Father   . Cancer Maternal Grandfather   . Hyperlipidemia Maternal Grandfather   . Stroke Paternal Grandmother   . Early death  Paternal Grandfather        Polio    Social History   Socioeconomic History  . Marital status: Married    Spouse name: Not on file  . Number of children: Not on file  . Years of education: Not on file  . Highest education level: Not on file  Occupational History  . Not on file  Social Needs  . Financial resource strain: Not on file  . Food insecurity:    Worry: Not on file    Inability: Not on file  . Transportation needs:    Medical: Not on file    Non-medical: Not on file  Tobacco Use  . Smoking status: Never Smoker  . Smokeless tobacco: Never Used  Substance and Sexual Activity  . Alcohol use: Yes    Alcohol/week: 15.0 standard drinks    Types: 14 Glasses of wine, 1 Cans of beer per week    Comment: Has wine daily, occasional beer and liquor  . Drug use: No  . Sexual activity: Yes  Lifestyle  . Physical activity:    Days per week: Not on file    Minutes per session: Not on file  . Stress: Not on file  Relationships  . Social connections:    Talks on phone: Not on file    Gets together: Not on file    Attends religious service: Not on file    Active member of club or organization: Not on file    Attends meetings of  clubs or organizations: Not on file    Relationship status: Not on file  Other Topics Concern  . Not on file  Social History Narrative  . Not on file   Review of Systems - See HPI.  All other ROS are negative.  BP (!) 150/90   Pulse 61   Temp 98.3 F (36.8 C) (Oral)   Resp 14   Ht 5\' 4"  (1.626 m)   SpO2 99%   BMI 23.52 kg/m   Physical Exam  Constitutional: She is oriented to person, place, and time and well-developed, well-nourished, and in no distress.  HENT:  Head: Normocephalic and atraumatic.  Eyes: Conjunctivae are normal.  Neck: Neck supple.  Cardiovascular: Normal rate and regular rhythm.  Pulmonary/Chest: Effort normal and breath sounds normal. No respiratory distress. She has no wheezes. She has no rales. She exhibits no  tenderness.  Neurological: She is alert and oriented to person, place, and time.  Skin: Skin is warm and dry.  Psychiatric: Affect normal.  Vitals reviewed.  Recent Results (from the past 2160 hour(s))  Basic metabolic panel     Status: Abnormal   Collection Time: 09/10/18  3:42 PM  Result Value Ref Range   Sodium 138 135 - 145 mmol/L   Potassium 3.7 3.5 - 5.1 mmol/L   Chloride 103 98 - 111 mmol/L   CO2 26 22 - 32 mmol/L   Glucose, Bld 122 (H) 70 - 99 mg/dL   BUN 17 6 - 20 mg/dL   Creatinine, Ser 0.80 0.44 - 1.00 mg/dL   Calcium 9.2 8.9 - 10.3 mg/dL   GFR calc non Af Amer >60 >60 mL/min   GFR calc Af Amer >60 >60 mL/min   Anion gap 9 5 - 15    Comment: Performed at [facility], [place] [address] [place], [place] [address]  CBC     Status: None   Collection Time: 09/10/18  3:42 PM  Result Value Ref Range   WBC 5.8 4.0 - 10.5 K/uL   RBC 4.32 3.87 - 5.11 MIL/uL   Hemoglobin 13.6 12.0 - 15.0 g/dL   HCT 40.6 36.0 - 46.0 %   MCV 94.0 80.0 - 100.0 fL   MCH 31.5 26.0 - 34.0 pg   MCHC 33.5 30.0 - 36.0 g/dL   RDW 12.5 11.5 - 15.5 %   Platelets 269 150 - 400 K/uL   nRBC 0.0 0.0 - 0.2 %    Comment: Performed at [facility], [place] [address] [place], [place] [address]  I-stat troponin, ED     Status: None   Collection Time: 09/10/18  3:51 PM  Result Value Ref Range   Troponin i, poc 0.03 0.00 - 0.08 ng/mL   Comment 3            Comment: Due to the release kinetics of cTnI, a negative result within the first hours of the onset of symptoms does not rule out myocardial infarction with certainty. If myocardial infarction is still suspected, repeat the test at appropriate intervals.   I-Stat beta hCG blood, ED     Status: None   Collection Time: 09/10/18  3:51 PM  Result Value Ref Range   I-stat hCG, quantitative <5.0 <5 mIU/mL   Comment 3            Comment:   GEST. AGE      CONC.  (mIU/mL)   <=1 WEEK        5 - 50  2 WEEKS       50 - 500     3 WEEKS       100 -  10,000     4 WEEKS     1,000 - 30,000        FEMALE AND NON-PREGNANT FEMALE:     LESS THAN 5 mIU/mL   D-dimer, quantitative (not at [facility])     Status: None   Collection Time: 09/10/18  4:58 PM  Result Value Ref Range   D-Dimer, Quant 0.38 0.00 - 0.50 ug/mL-FEU    Comment: (NOTE) At the manufacturer cut-off of 0.50 ug/mL FEU, this assay has been documented to exclude PE with a sensitivity and negative predictive value of 97 to 99%.  At this time, this assay has not been approved by the FDA to exclude DVT/VTE. Results should be correlated with clinical presentation. Performed at [facility], [place] [address] [place], [place] [address]   TSH     Status: None   Collection Time: 09/10/18  4:58 PM  Result Value Ref Range   TSH 2.855 0.350 - 4.500 uIU/mL    Comment: Performed by a 3rd Generation assay with a functional sensitivity of <=0.01 uIU/mL. Performed at [facility], [place] [address] [place], [place] [address]   I-Stat Troponin, ED (not at [facility])     Status: None   Collection Time: 09/10/18  7:23 PM  Result Value Ref Range   Troponin i, poc 0.04 0.00 - 0.08 ng/mL   Comment 3            Comment: Due to the release kinetics of cTnI, a negative result within the first hours of the onset of symptoms does not rule out myocardial infarction with certainty. If myocardial infarction is still suspected, repeat the test at appropriate intervals.     Assessment/Plan: 1. Hypertension, unspecified type 2. Abnormal EKG Imaging, Labs and EKG reviewed. BP still above goal. Exam unremarkable today. Has only been on CCB for a couple of days so not surprised BP is still high. She is having side effect of medication and low resting heart rate at baseline so will stop Amlodipine. Start Valsartan 40 mg daily. Continue DASH diet. No need for repeat labs today. Will expedite Cardiology appointment giving new onset HTN and LAD on EKG. Strict ER precautions reviewed. Will set up follow-up  once we get a time frame on specialist appointment.    Leeanne Rio, PA-C

## 2018-09-18 ENCOUNTER — Telehealth: Payer: Self-pay | Admitting: Interventional Cardiology

## 2018-09-18 ENCOUNTER — Encounter: Payer: Self-pay | Admitting: Cardiovascular Disease

## 2018-09-18 ENCOUNTER — Ambulatory Visit: Payer: PRIVATE HEALTH INSURANCE | Admitting: Cardiovascular Disease

## 2018-09-18 ENCOUNTER — Telehealth: Payer: Self-pay | Admitting: Emergency Medicine

## 2018-09-18 ENCOUNTER — Other Ambulatory Visit: Payer: Self-pay

## 2018-09-18 ENCOUNTER — Ambulatory Visit: Payer: Self-pay | Admitting: *Deleted

## 2018-09-18 DIAGNOSIS — I10 Essential (primary) hypertension: Secondary | ICD-10-CM | POA: Insufficient documentation

## 2018-09-18 DIAGNOSIS — R9431 Abnormal electrocardiogram [ECG] [EKG]: Secondary | ICD-10-CM

## 2018-09-18 DIAGNOSIS — R079 Chest pain, unspecified: Secondary | ICD-10-CM

## 2018-09-18 DIAGNOSIS — G8929 Other chronic pain: Secondary | ICD-10-CM | POA: Insufficient documentation

## 2018-09-18 NOTE — Telephone Encounter (Signed)
New Message          [facility] with [facility] [employer] is calling to see if the patient can be seen sooner than 05/26, Pls call to advise

## 2018-09-18 NOTE — Progress Notes (Signed)
Cardiology Office Note:    Date:  09/18/2018   ID:  [name], DOB February 18, 1971, MRN [id number]  PCP:  Brunetta Jeans, PA-C  Cardiologist:  Reola Calkins, Krishauna Schatzman  Electrophysiologist:  None   Referring MD: Brunetta Jeans, PA-C   Chief Complaint  Patient presents with   Chest Pain     History of Present Illness:    Theresa Raymond is a 48 y.o. female seen for  atypical chest pain.  No cardiac hx  typicaly is very healthy ,  Runs 15 - 20 miles a week  1 week ago,  CP , achy in her chest  Has sharp pain ,  No pleuretic ,  Not positional .  Only last for several seconds  Also has had some back pain ,  Achy back pain ,  Not ripping or tearing   No smoker,    Went to the ER ,    BP was 230 / Had marked HTN.    Troponin and d-dimer were negative.  Chest x-ray was unremarkable.  CT angiogram of the chest and abdomen was negative for aortic dissection.   Was started on amlodipine prmary MD changed it to valsartan Watches her diet,  Not much salt.   Has not been exposed to anyone sick.  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   Multiple Vitamins-Minerals (MULTIVITAMIN ADULT PO) Take 1 tablet by mouth daily.   [DISCONTINUED] valsartan (DIOVAN) 40 MG tablet Take 1 tablet (40 mg total) by mouth daily.     Allergies:   Other   Social History   Socioeconomic History   Marital status: Married    Spouse name: Not on file   Number of children: Not on file   Years of education: Not on file   Highest education level: Not on file  Occupational History   Not on file  Social Needs    Financial resource strain: Not on file   Food insecurity:    Worry: Not on file    Inability: Not on file   Transportation needs:    Medical: Not on file    Non-medical: Not on file  Tobacco Use   Smoking status: Never Smoker   Smokeless tobacco: Never Used  Substance and Sexual Activity   Alcohol use: Yes    Alcohol/week: 15.0 standard drinks    Types: 14 Glasses of wine, 1 Cans of beer per week    Comment: Has wine daily, occasional beer and liquor   Drug use: No   Sexual activity: Yes  Lifestyle   Physical activity:    Days per week: Not on file    Minutes per session: Not on file   Stress: Not on file  Relationships   Social connections:    Talks on phone: Not on file    Gets together: Not on file    Attends religious service: Not on file    Active member of club or organization: Not on file    Attends meetings of clubs or organizations: Not on file  Relationship status: Not on file  Other Topics Concern   Not on file  Social History Narrative   Not on file     Family History: The patient's family history includes Cancer in her maternal grandfather; Depression in her mother; Diabetes in her mother; Early death in her paternal grandfather; Hearing loss in her father; Hyperlipidemia in her maternal grandfather; Miscarriages / [place] in her mother; Stroke in her father and paternal grandmother.  ROS:   Please see the history of present illness.     All other systems reviewed and are negative.  EKGs/Labs/Other Studies Reviewed:    The following studies were reviewed today: ER records.  Will do a virtual visit in a couple weeks  EKG:  EKG  March 23 -the first EKG showed poor R wave progression which I suspect was due to lead placement.  The subsequent EKG revealed normal R wave progression. Recent Labs: 03/01/2018: ALT 13 09/10/2018: BUN 17; Creatinine, Ser 0.80; Hemoglobin 13.6; Platelets 269; Potassium 3.7; Sodium 138; TSH 2.855  Recent Lipid  Panel    Component Value Date/Time   CHOL 181 03/01/2018 1038   TRIG 52.0 03/01/2018 1038   HDL 92.70 03/01/2018 1038   CHOLHDL 2 03/01/2018 1038   VLDL 10.4 03/01/2018 1038   LDLCALC 78 03/01/2018 1038    Physical Exam:    VS:  BP 130/86    Pulse 74    Ht 5\' 4"  (1.626 m)    Wt 140 lb (63.5 kg)    LMP 08/31/2018    SpO2 98%    BMI 24.03 kg/m     Wt Readings from Last 3 Encounters:  09/18/18 140 lb (63.5 kg)  03/06/18 137 lb (62.1 kg)  03/01/18 135 lb 9.6 oz (61.5 kg)     GEN:  Well nourished, well developed in no acute distress, HEENT: Normal NECK: No JVD; No carotid bruits LYMPHATICS: No lymphadenopathy CARDIAC: RRR, soft systolic murmur at the left lower sternal border.  There is no radiation to the axilla.  There is no radiation to the right sternal border No pericardial friction rub RESPIRATORY:  Clear to auscultation without rales, wheezing or rhonchi  ABDOMEN: Soft, non-tender, non-distended MUSCULOSKELETAL:  No edema; No deformity  SKIN: Warm and dry NEUROLOGIC:  Alert and oriented x 3 PSYCHIATRIC:  Normal affect good  ASSESSMENT:    No diagnosis found. PLAN:    In order of problems listed above:  1. Hypertension: The patient was seen in the emergency room on March 23.  She was markedly hypertensive and was having episodes of sharp chest pain with radiation to the back. She was started on amlodipine which was later changed to valsartan 40 mg a day. And continue to monitor for now.  2.  Atypical chest pain: The patient had some symptoms that were concerning for aortic dissection.  CT angiogram of the chest has already been done and aortic  dissection has been ruled out.  3.  Heart murmur:   On exam she has a soft systolic murmur which I think is we will get an echocardiogram in a month or so for further evaluation but I do not think that this is urgent.  I suspect that she had some sort of a viral illness.  I have asked her to not exercise until she is  generally feeling better.  I suspect that she will improve has she gets further out from her viral illness.   We will set her up for a telemedicine visit in  3 weeks.  Medication Adjustments/Labs and Tests Ordered: Current medicines are reviewed at length with the patient today.  Concerns regarding medicines are outlined above.  No orders of the defined types were placed in this encounter.  No orders of the defined types were placed in this encounter.   Patient Instructions  Medication Instructions:  Your physician has recommended you make the following change in your medication:  STOP Diovan (Valsartan) and record BP readings Call with concerns   If you need a refill on your cardiac medications before your next appointment, please call your pharmacy.   Lab work: None Ordered   Testing/Procedures: None Ordered   Follow-Up: Your physician recommends that you have a follow-up appointment in: 3 weeks on April 27 at 10:40 am via Webex with Dr. Acie Fredrickson   YOUR CARDIOLOGY TEAM HAS ARRANGED FOR AN E-VISIT FOR YOUR APPOINTMENT - PLEASE REVIEW IMPORTANT INFORMATION BELOW SEVERAL DAYS PRIOR TO YOUR APPOINTMENT  Due to the recent COVID-19 pandemic, we are transitioning in-person office visits to tele-medicine visits in an effort to decrease unnecessary exposure to our patients and staff. Medicare and most insurances are covering these visits without a copay needed. We also encourage you to sign up for MyChart if you have not already done so. You will need a smartphone if possible. For patients that do not have this, we can still complete the visit using a regular telephone but do prefer a smartphone to enable video when possible. You may have a close family member that lives with you that can help. If possible, we also ask that you have a blood pressure cuff and scale at home to measure your blood pressure, heart rate and weight prior to your scheduled appointment. Patients with clinical needs  that need an in-person evaluation and testing will still be able to come to the office if absolutely necessary. If you have any questions, feel free to call our office.    IF YOU HAVE A SMARTPHONE, PLEASE DOWNLOAD THE WEBEX APP TO YOUR SMARTPHONE  - If Apple, go to [employer] and type in WebEx in the search bar. [place] [employer], the blue/green circle. The app is free but as with any other app download, your phone may require you to verify saved payment information or Apple password. You do NOT have to create a WebEx account.  - If Android, go to [employer] and type in [employer] in the search bar. [place] [employer], the blue/green circle. The app is free but as with any other app download, your phone may require you to verify saved payment information or Android password. You do NOT have to create a WebEx account.  It is very helpful to have this downloaded before your visit.    2-3 DAYS BEFORE YOUR APPOINTMENT  You will receive a telephone call from one of our [facility] team members - your caller ID may say "Unknown caller." If this is a video visit, we will confirm that you have been able to download the WebEx app. We will remind you check your blood pressure, heart rate and weight prior to your scheduled appointment. If you have an Apple Watch or Kardia, please upload any pertinent ECG strips the day before or morning of your appointment to [facility]. Our staff will also make sure you have reviewed the consent and agree to move forward with your scheduled tele-health visit.     THE DAY OF YOUR APPOINTMENT  Approximately 15 minutes prior to your scheduled appointment,  you will receive a telephone call from one of [facility] team - your caller ID may say "Unknown caller."  Our staff will confirm medications, vital signs for the day and any symptoms you may be experiencing. Please have this information available prior to the time of visit start. It may also be helpful  for you to have a pad of paper and pen handy for any instructions given during your visit. They will also walk you through joining the WebEx smartphone meeting if this is a video visit.    CONSENT FOR TELE-HEALTH VISIT - PLEASE REVIEW  I hereby voluntarily request, consent and authorize [facility] and its employed or contracted physicians, physician assistants, nurse practitioners or other licensed health care professionals (the Practitioner), to provide me with telemedicine health care services (the Services") as deemed necessary by the treating Practitioner. I acknowledge and consent to receive the Services by the Practitioner via telemedicine. I understand that the telemedicine visit will involve communicating with the Practitioner through live audiovisual communication technology and the disclosure of certain medical information by electronic transmission. I acknowledge that I have been given the opportunity to request an in-person assessment or other available alternative prior to the telemedicine visit and am voluntarily participating in the telemedicine visit.  I understand that I have the right to withhold or withdraw my consent to the use of telemedicine in the course of my care at any time, without affecting my right to future care or treatment, and that the Practitioner or I may terminate the telemedicine visit at any time. I understand that I have the right to inspect all information obtained and/or recorded in the course of the telemedicine visit and may receive copies of available information for a reasonable fee.  I understand that some of the potential risks of receiving the Services via telemedicine include:   Delay or interruption in medical evaluation due to technological equipment failure or disruption;  Information transmitted may not be sufficient (e.g. poor resolution of images) to allow for appropriate medical decision making by the Practitioner; and/or   In rare instances,  security protocols could fail, causing a breach of personal health information.  Furthermore, I acknowledge that it is my responsibility to provide information about my medical history, conditions and care that is complete and accurate to the best of my ability. I acknowledge that Practitioner's advice, recommendations, and/or decision may be based on factors not within their control, such as incomplete or inaccurate data provided by me or distortions of diagnostic images or specimens that may result from electronic transmissions. I understand that the practice of medicine is not an exact science and that Practitioner makes no warranties or guarantees regarding treatment outcomes. I acknowledge that I will receive a copy of this consent concurrently upon execution via email to the email address I last provided but may also request a printed copy by calling the office of [facility].    I understand that my insurance will be billed for this visit.   I have read or had this consent read to me.  I understand the contents of this consent, which adequately explains the benefits and risks of the Services being provided via telemedicine.   I have been provided ample opportunity to ask questions regarding this consent and the Services and have had my questions answered to my satisfaction.  I give my informed consent for the services to be provided through the use of telemedicine in my medical care  By participating in this telemedicine  visit I agree to the above.      Signed, Mertie Moores, MD  09/18/2018 4:51 PM    [facility]

## 2018-09-18 NOTE — Telephone Encounter (Signed)
Called patient to check status since visit. Patient states she is still having chest pains, numbness of tips of fingers and toes, tingling of her face, dizziness when standing and fatigue. She states her symptoms not improving BP running 170/88. Taking Valsartan daily.

## 2018-09-18 NOTE — Telephone Encounter (Signed)
Spoke with Levada Dy and she states pt recently in the hospital for SOB.  Tested negative for Corona Virus.  Had abnormal EKG, CP and HTN.  Sx are unchanged.  PCP would like pt seen sooner.  Spoke with Christen Bame, RN and she said ok to add to Dr. Elmarie Shiley DOD schedule today.  Spoke with pt and she is agreeable to come to the office today for an appt.

## 2018-09-18 NOTE — Telephone Encounter (Signed)
Check on status of Cardiology appt. Let's see if this can be expedited.    If it were just fatigue and elevated BP, would increase Diovan further to 80 mg daily until she saw Cardiology. We stopped the amlodipine due to her low resting heart rate and level of fatigue in hopes this would help. With her other ongoing symptoms however, ER assessment is warranted. They may want to keep her and get both Cardiology and Neurology consults to assess everything further there instead of waiting to schedule OP. That may take a while OP due to the current pandemic.

## 2018-09-18 NOTE — Telephone Encounter (Signed)
Message from [employer] sent at 09/18/2018 1:22 PM EDT   Summary: dizziness       "States that she is dizzy and has numbness in the tips of her fingers and would like to know if this is normal. " Contacted pt regarding her symptoms; she states that she spoke with someone "about her symptoms"; see encounter dated 09/18/2018 at 1327;  will close nurse triage encounter.

## 2018-09-18 NOTE — Patient Instructions (Addendum)
Medication Instructions:  Your physician has recommended you make the following change in your medication:  STOP Diovan (Valsartan) and record BP readings Call with concerns   If you need a refill on your cardiac medications before your next appointment, please call your pharmacy.   Lab work: None Ordered   Testing/Procedures: None Ordered   Follow-Up: Your physician recommends that you have a follow-up appointment in: 3 weeks on April 27 at 10:40 am via Webex with Dr. Acie Raymond   YOUR CARDIOLOGY TEAM HAS ARRANGED FOR AN E-VISIT FOR YOUR APPOINTMENT - PLEASE REVIEW IMPORTANT INFORMATION BELOW SEVERAL DAYS PRIOR TO YOUR APPOINTMENT  Due to the recent COVID-19 pandemic, we are transitioning in-person office visits to tele-medicine visits in an effort to decrease unnecessary exposure to our patients and staff. Medicare and most insurances are covering these visits without a copay needed. We also encourage you to sign up for MyChart if you have not already done so. You will need a smartphone if possible. For patients that do not have this, we can still complete the visit using a regular telephone but do prefer a smartphone to enable video when possible. You may have a close family member that lives with you that can help. If possible, we also ask that you have a blood pressure cuff and scale at home to measure your blood pressure, heart rate and weight prior to your scheduled appointment. Patients with clinical needs that need an in-person evaluation and testing will still be able to come to the office if absolutely necessary. If you have any questions, feel free to call our office.    IF YOU HAVE A SMARTPHONE, PLEASE DOWNLOAD THE WEBEX APP TO YOUR SMARTPHONE  - If Apple, go to [employer] and type in WebEx in the search bar. [place] [employer], the blue/green circle. The app is free but as with any other app download, your phone may require you to verify saved payment information or  Apple password. You do NOT have to create a WebEx account.  - If Android, go to [employer] and type in [employer] in the search bar. [place] [employer], the blue/green circle. The app is free but as with any other app download, your phone may require you to verify saved payment information or Android password. You do NOT have to create a WebEx account.  It is very helpful to have this downloaded before your visit.    2-3 DAYS BEFORE YOUR APPOINTMENT  You will receive a telephone call from one of our [place] team members - your caller ID may say "Unknown caller." If this is a video visit, we will confirm that you have been able to download the WebEx app. We will remind you check your blood pressure, heart rate and weight prior to your scheduled appointment. If you have an Apple Watch or Kardia, please upload any pertinent ECG strips the day before or morning of your appointment to [place]. Our staff will also make sure you have reviewed the consent and agree to move forward with your scheduled tele-health visit.     THE DAY OF YOUR APPOINTMENT  Approximately 15 minutes prior to your scheduled appointment, you will receive a telephone call from one of [facility] team - your caller ID may say "Unknown caller."  Our staff will confirm medications, vital signs for the day and any symptoms you may be experiencing. Please have this information available prior to the time of visit start. It may also be helpful  for you to have a pad of paper and pen handy for any instructions given during your visit. They will also walk you through joining the WebEx smartphone meeting if this is a video visit.    CONSENT FOR TELE-HEALTH VISIT - PLEASE REVIEW  I hereby voluntarily request, consent and authorize [facility] and its employed or contracted physicians, physician assistants, nurse practitioners or other licensed health care professionals (the Practitioner), to provide me with telemedicine  health care services (the "Services") as deemed necessary by the treating Practitioner. I acknowledge and consent to receive the Services by the Practitioner via telemedicine. I understand that the telemedicine visit will involve communicating with the Practitioner through live audiovisual communication technology and the disclosure of certain medical information by electronic transmission. I acknowledge that I have been given the opportunity to request an in-person assessment or other available alternative prior to the telemedicine visit and am voluntarily participating in the telemedicine visit.  I understand that I have the right to withhold or withdraw my consent to the use of telemedicine in the course of my care at any time, without affecting my right to future care or treatment, and that the Practitioner or I may terminate the telemedicine visit at any time. I understand that I have the right to inspect all information obtained and/or recorded in the course of the telemedicine visit and may receive copies of available information for a reasonable fee.  I understand that some of the potential risks of receiving the Services via telemedicine include:  Marland Kitchen Delay or interruption in medical evaluation due to technological equipment failure or disruption; . Information transmitted may not be sufficient (e.g. poor resolution of images) to allow for appropriate medical decision making by the Practitioner; and/or  . In rare instances, security protocols could fail, causing a breach of personal health information.  Furthermore, I acknowledge that it is my responsibility to provide information about my medical history, conditions and care that is complete and accurate to the best of my ability. I acknowledge that Practitioner's advice, recommendations, and/or decision may be based on factors not within their control, such as incomplete or inaccurate data provided by me or distortions of diagnostic images or  specimens that may result from electronic transmissions. I understand that the practice of medicine is not an exact science and that Practitioner makes no warranties or guarantees regarding treatment outcomes. I acknowledge that I will receive a copy of this consent concurrently upon execution via email to the email address I last provided but may also request a printed copy by calling the office of [facility].    I understand that my insurance will be billed for this visit.   I have read or had this consent read to me. . I understand the contents of this consent, which adequately explains the benefits and risks of the Services being provided via telemedicine.  . I have been provided ample opportunity to ask questions regarding this consent and the Services and have had my questions answered to my satisfaction. . I give my informed consent for the services to be provided through the use of telemedicine in my medical care  By participating in this telemedicine visit I agree to the above.

## 2018-09-18 NOTE — Telephone Encounter (Signed)
Spoke with patient and advised of PCP recommendations. Patient just received a call from Cardiology and they can get her in today for an appointment @ 3:40p.

## 2018-09-20 MED ORDER — VALSARTAN 40 MG PO TABS: 40.0000 mg | ORAL_TABLET | Freq: Every day | ORAL | 11 refills | Status: DC

## 2018-09-28 ENCOUNTER — Telehealth: Payer: Self-pay

## 2018-09-28 NOTE — Telephone Encounter (Signed)
I spoke to the patient who sent a My Chart message stating that her BP remains elevated.  Recorded in My Chart message.  She has been conversing back and forth with Sharyn Lull and Dr Acie Fredrickson and was told to update [place] today, Friday 4/10.  She checks her BP 4 times daily and that she usually needs to take an extra Valsartan 40 mg as recommended.    She said that this morning she went out for a walk with family members and that she had a brief episode of CP, lasting only a few minutes, BP was 138/82.  She is now home resting, just feeling tired.

## 2018-09-30 ENCOUNTER — Other Ambulatory Visit: Payer: Self-pay | Admitting: Cardiovascular Disease

## 2018-09-30 MED ORDER — VALSARTAN 40 MG PO TABS: 40.0000 mg | ORAL_TABLET | Freq: Two times a day (BID) | ORAL | 11 refills | Status: DC

## 2018-10-01 MED ORDER — HYDROCHLOROTHIAZIDE 25 MG PO TABS: 25.0000 mg | ORAL_TABLET | Freq: Every day | ORAL | 11 refills | Status: DC

## 2018-10-01 MED ORDER — POTASSIUM CHLORIDE ER 10 MEQ PO TBCR: 10.0000 meq | EXTENDED_RELEASE_TABLET | Freq: Every day | ORAL | 11 refills | Status: DC

## 2018-10-01 NOTE — Addendum Note (Signed)
Addended by: Emmaline Life on: 10/01/2018 05:33 PM   Modules accepted: Orders

## 2018-10-01 NOTE — Telephone Encounter (Signed)
I talked to Theresa Raymond today on the telephone.  She continues to have episodes of chest pain and chest twinges.  These pains only last for 1 second or so.  Is occasionally occur with exertion.  She had 1 which required her to stop and rest for a few minutes.  The pain is never last more than a few seconds.  Her blood pressure continues to be elevated.  She is taking valsartan 80 mg a day.  We will add hydrochlorothiazide 25 mg a day and potassium chloride 10 mEq a day.  She will need [facility] called into [facility] on [address] and [address].  She will continue to record her blood pressure readings regularly. She also knows him via [place]. Ideally I would like to perform a stress test or perhaps a coronary CT angiogram but will need to wait until the COVID-19 outbreak is under better control.  I have instructed her to call 911 if she develops severe episodes of chest pain that that do not resolve quickly.

## 2018-10-02 MED ORDER — VALSARTAN 80 MG PO TABS: 80.0000 mg | ORAL_TABLET | Freq: Every day | ORAL | 3 refills | Status: DC

## 2018-10-04 ENCOUNTER — Other Ambulatory Visit: Payer: Self-pay | Admitting: Physician Assistant

## 2018-10-09 ENCOUNTER — Telehealth: Payer: Self-pay | Admitting: Cardiovascular Disease

## 2018-10-09 NOTE — Telephone Encounter (Signed)
Spoke with patient who confirmed all demographics.  She uses My Chart regularly. Has smart phone.

## 2018-10-15 ENCOUNTER — Telehealth (INDEPENDENT_AMBULATORY_CARE_PROVIDER_SITE_OTHER): Payer: PRIVATE HEALTH INSURANCE | Admitting: Cardiovascular Disease

## 2018-10-15 ENCOUNTER — Other Ambulatory Visit: Payer: Self-pay

## 2018-10-15 ENCOUNTER — Encounter: Payer: Self-pay | Admitting: Cardiovascular Disease

## 2018-10-15 VITALS — BP 117/84 | HR 72 | Ht 63.0 in | Wt 133.6 lb

## 2018-10-15 DIAGNOSIS — R002 Palpitations: Secondary | ICD-10-CM

## 2018-10-15 DIAGNOSIS — R079 Chest pain, unspecified: Secondary | ICD-10-CM | POA: Diagnosis not present

## 2018-10-15 DIAGNOSIS — Z7189 Other specified counseling: Secondary | ICD-10-CM

## 2018-10-15 DIAGNOSIS — I10 Essential (primary) hypertension: Secondary | ICD-10-CM | POA: Diagnosis not present

## 2018-10-15 NOTE — Patient Instructions (Signed)
Medication Instructions:  Your physician recommends that you continue on your current medications as directed. Please refer to the Current Medication list given to you today.  If you need a refill on your cardiac medications before your next appointment, please call your pharmacy.    Lab work: None Ordered    Testing/Procedures: Your physician has recommended that you wear an event monitor. Event monitors are medical devices that record the heart's electrical activity. Doctors most often [place] these monitors to diagnose arrhythmias. Arrhythmias are problems with the speed or rhythm of the heartbeat. The monitor is a small, portable device. You can wear one while you do your normal daily activities. This is usually used to diagnose what is causing palpitations/syncope (passing out).    Follow-Up: Your physician recommends that you schedule a follow-up appointment in: 1 month with Dr. Acie Fredrickson **We will call you to schedule based on Covid 19 restrictions and monitor results

## 2018-10-15 NOTE — Progress Notes (Signed)
Virtual Visit via Video Note   This visit type was conducted due to national recommendations for restrictions regarding the COVID-19 Pandemic (e.g. social distancing) in an effort to limit this patient's exposure and mitigate transmission in our community.  Due to her co-morbid illnesses, this patient is at least at moderate risk for complications without adequate follow up.  This format is felt to be most appropriate for this patient at this time.  All issues noted in this document were discussed and addressed.  A limited physical exam was performed with this format.  Please refer to the patient's chart for her consent to telehealth for [facility].   Evaluation Performed:  Follow-up visit  Date:  10/15/2018   ID:  Theresa Raymond, DOB 07-06-1970, MRN [id number]  Patient Location: Home Provider Location: Office  PCP:  Brunetta Jeans, PA-C  Cardiologist:  No primary care provider on file.  Electrophysiologist:  None   Chief Complaint:   Chest pain ,  HTN   October 15, 2018    Theresa Raymond is a 48 y.o. female with episodes of hypertension and atypical chest pain.  Shawntel is a previous runner.  She is not been able to run for chest tightness and shortness of breath.  She was seen in the emergency room in March, 2020.  She is been on blood pressure medication since that time.  Her most recent blood pressure readings largely in the normal range.  Her heart rate has been in the normal range.  This past Saturday , was planting plants. Was in the shower,  Bending over washing her legs, had sharp chest pains, quick jab.  No heavy pressure or burning  Associated with increased shortness of breath Has occasional dizziness,  Has occasions then she is really hot  Wakes up at night with palpitations  Typically in the early morning hours   Is walking 3 miles in the am,   1-2 in the evening      The patient does not have symptoms concerning for COVID-19 infection (fever, chills,  cough, or new shortness of breath).    Past Medical History:  Diagnosis Date  . Chicken pox   . Migraines   . Thyroid disease    Past Surgical History:  Procedure Laterality Date  . CERVIX SURGERY  1992  . CLAVICLE SURGERY  2006     Current Meds  Medication Sig  . hydrochlorothiazide (HYDRODIURIL) 25 MG tablet Take 1 tablet (25 mg total) by mouth daily.  Marland Kitchen levothyroxine (SYNTHROID, LEVOTHROID) 100 MCG tablet TAKE 1 TABLET (100 MCG TOTAL) BY MOUTH DAILY BEFORE BREAKFAST.  . Multiple Vitamins-Minerals (MULTIVITAMIN ADULT PO) Take 1 tablet by mouth daily.  . potassium chloride (K-DUR) 10 MEQ tablet Take 1 tablet (10 mEq total) by mouth daily.  . valsartan (DIOVAN) 80 MG tablet Take 1 tablet (80 mg total) by mouth daily.     Allergies:   Other   Social History   Tobacco Use  . Smoking status: Never Smoker  . Smokeless tobacco: Never Used  Substance Use Topics  . Alcohol use: Yes    Alcohol/week: 15.0 standard drinks    Types: 14 Glasses of wine, 1 Cans of beer per week    Comment: Has wine daily, occasional beer and liquor  . Drug use: No     Family Hx: The patient's family history includes Cancer in her maternal grandfather; Depression in her mother; Diabetes in her mother; Early death in her paternal grandfather; Hearing loss in  her father; Hyperlipidemia in her maternal grandfather; Miscarriages / [place] in her mother; Stroke in her father and paternal grandmother.  ROS:   Please see the history of present illness.     All other systems reviewed and are negative.   Prior CV studies:   The following studies were reviewed today:    Labs/Other Tests and Data Reviewed:    EKG:  An ECG dated 09/12/18 was personally reviewed today and demonstrated:   NSR   Recent Labs: 03/01/2018: ALT 13 09/10/2018: BUN 17; Creatinine, Ser 0.80; Hemoglobin 13.6; Platelets 269; Potassium 3.7; Sodium 138; TSH 2.855   Recent Lipid Panel Lab Results  Component Value Date/Time    CHOL 181 03/01/2018 10:38 AM   TRIG 52.0 03/01/2018 10:38 AM   HDL 92.70 03/01/2018 10:38 AM   CHOLHDL 2 03/01/2018 10:38 AM   LDLCALC 78 03/01/2018 10:38 AM    Wt Readings from Last 3 Encounters:  10/15/18 133 lb 9.6 oz (60.6 kg)  09/18/18 140 lb (63.5 kg)  03/06/18 137 lb (62.1 kg)     Objective:    Vital Signs:  BP 117/84 (BP Location: Left Arm, Patient Position: Sitting, Cuff Size: Normal)   Pulse 72   Ht 5\' 3"  (1.6 m)   Wt 133 lb 9.6 oz (60.6 kg)   BMI 23.67 kg/m    VITAL SIGNS:  reviewed GEN:  no acute distress EYES:  sclerae anicteric, EOMI - Extraocular Movements Intact RESPIRATORY:  normal respiratory effort, symmetric expansion CARDIOVASCULAR:  no peripheral edema SKIN:  no rash, lesions or ulcers. MUSCULOSKELETAL:  no obvious deformities. NEURO:  alert and oriented x 3, no obvious focal deficit PSYCH:  normal affect  ASSESSMENT & PLAN:    1. Chest pain :   Deone is continued to have episodes of atypical chest pain.  These episodes are quite atypical.  They are described as sharp jabs.  They can occur at rest but also can occur with exertion.  She has not been able to run for quite some time because of shortness of breath and these chest pains.  2. I encouraged her to get back into running at a low level.  We will order an event monitor to see if these episodes of chest pain are related to an arrhythmia.  In time I would like to do a coronary CT angiogram and also an echocardiogram but were not able to do that at this time . Will see her in see the office visit  1 month    2.  Essential HTN:    BP is generally better .  contineu Valsartan 80 mg a day HCTZ 25 mg a day do we have to have Nellene on potassium hopefully we do certain the most    [id number] Education: The signs and symptoms of COVID-19 were discussed with the patient and how to seek care for testing (follow up with PCP or arrange E-visit).  The importance of social distancing was discussed today.   Time:   Today, I have spent  25  minutes with the patient with telehealth technology discussing the above problems.     Medication Adjustments/Labs and Tests Ordered: Current medicines are reviewed at length with the patient today.  Concerns regarding medicines are outlined above.   Tests Ordered: No orders of the defined types were placed in this encounter.   Medication Changes: No orders of the defined types were placed in this encounter.   Disposition:  Follow up in 1 month(s)  Signed, Arnette Norris  Znya Albino, MD  10/15/2018 10:04 AM    [place]

## 2018-10-17 ENCOUNTER — Telehealth: Payer: Self-pay | Admitting: *Deleted

## 2018-10-17 NOTE — Telephone Encounter (Signed)
Patient enrolled for [employer] to ship a 30 day cardiac event monitor to her home.  Instructions reviewed briefly as they will be included in her monitor kit.

## 2018-10-19 ENCOUNTER — Ambulatory Visit (INDEPENDENT_AMBULATORY_CARE_PROVIDER_SITE_OTHER): Payer: PRIVATE HEALTH INSURANCE

## 2018-10-19 DIAGNOSIS — R079 Chest pain, unspecified: Secondary | ICD-10-CM

## 2018-10-19 DIAGNOSIS — R002 Palpitations: Secondary | ICD-10-CM | POA: Diagnosis not present

## 2018-11-06 ENCOUNTER — Other Ambulatory Visit: Payer: Self-pay

## 2018-11-06 ENCOUNTER — Encounter: Payer: Self-pay | Admitting: Cardiovascular Disease

## 2018-11-06 ENCOUNTER — Telehealth (INDEPENDENT_AMBULATORY_CARE_PROVIDER_SITE_OTHER): Payer: PRIVATE HEALTH INSURANCE | Admitting: Cardiovascular Disease

## 2018-11-06 VITALS — BP 138/80 | HR 71 | Ht 63.0 in | Wt 135.0 lb

## 2018-11-06 DIAGNOSIS — R002 Palpitations: Secondary | ICD-10-CM | POA: Diagnosis not present

## 2018-11-06 DIAGNOSIS — I119 Hypertensive heart disease without heart failure: Secondary | ICD-10-CM

## 2018-11-06 DIAGNOSIS — Z7189 Other specified counseling: Secondary | ICD-10-CM

## 2018-11-06 DIAGNOSIS — R079 Chest pain, unspecified: Secondary | ICD-10-CM | POA: Diagnosis not present

## 2018-11-06 DIAGNOSIS — I10 Essential (primary) hypertension: Secondary | ICD-10-CM | POA: Diagnosis not present

## 2018-11-06 NOTE — Patient Instructions (Signed)
Medication Instructions:  Your physician recommends that you continue on your current medications as directed. Please refer to the Current Medication list given to you today. Your physician recommends that you hold HCTZ and Potassium on days that you go out running or are going to be working outside with potential for a great deal of sweating.  If you need a refill on your cardiac medications before your next appointment, please call your pharmacy.    Lab work: None Ordered   Testing/Procedures: Your physician has requested that you have an echocardiogram. Echocardiography is a painless test that uses sound waves to create images of your heart. It provides your doctor with information about the size and shape of your heart and how well your heart's chambers and valves are working. This procedure takes approximately one hour. There are no restrictions for this procedure.    Follow-Up: At [facility], you and your health needs are our priority.  As part of our continuing mission to provide you with exceptional heart care, we have created designated Provider Care Teams.  These Care Teams include your primary Cardiologist (physician) and Advanced Practice Providers (APPs -  Physician Assistants and Nurse Practitioners) who all work together to provide you with the care you need, when you need it. You will need a follow up appointment in:  2 months.  You may see Dr. Acie Fredrickson or one of the following Advanced Practice Providers on your designated Care Team: Richardson Dopp, PA-C [place], [place] . Daune Perch, NP

## 2018-11-06 NOTE — Progress Notes (Signed)
Virtual Visit via Video Note   This visit type was conducted due to national recommendations for restrictions regarding the COVID-19 Pandemic (e.g. social distancing) in an effort to limit this patient's exposure and mitigate transmission in our community.  Due to her co-morbid illnesses, this patient is at least at moderate risk for complications without adequate follow up.  This format is felt to be most appropriate for this patient at this time.  All issues noted in this document were discussed and addressed.  A limited physical exam was performed with this format.  Please refer to the patient's chart for her consent to telehealth for [facility].   Date:  11/06/2018   ID:  Theresa Raymond, DOB 04/03/1971, MRN [id number]  Patient Location: Home Provider Location: Home  PCP:  Brunetta Jeans, PA-C  Cardiologist:     Electrophysiologist:  None   Evaluation Performed:  Follow-Up Visit  Chief Complaint:  Chest pain   History of Present Illness:    Theresa Raymond is a 48 y.o. female with hx of HTN and atypical CP.  BP has been well controlled.  Has good days and then bad days. Has palpitations of occasion but her HR is normal.  Lots of her symptoms sound like volume depletion but her BP is in the normal range.   Also she is on HCTZ and kdur.   Also on valsartan  Ran a week ago and felt well    The patient does not have symptoms concerning for COVID-19 infection (fever, chills, cough, or new shortness of breath).    Past Medical History:  Diagnosis Date  . Chicken pox   . Migraines   . Thyroid disease    Past Surgical History:  Procedure Laterality Date  . CERVIX SURGERY  1992  . CLAVICLE SURGERY  2006     Current Meds  Medication Sig  . hydrochlorothiazide (HYDRODIURIL) 25 MG tablet Take 1 tablet (25 mg total) by mouth daily.  Marland Kitchen levothyroxine (SYNTHROID, LEVOTHROID) 100 MCG tablet TAKE 1 TABLET (100 MCG TOTAL) BY MOUTH DAILY BEFORE BREAKFAST.  . Multiple  Vitamins-Minerals (MULTIVITAMIN ADULT PO) Take 1 tablet by mouth daily.  . potassium chloride (K-DUR) 10 MEQ tablet Take 1 tablet (10 mEq total) by mouth daily.  . valsartan (DIOVAN) 80 MG tablet Take 1 tablet (80 mg total) by mouth daily.     Allergies:   Other   Social History   Tobacco Use  . Smoking status: Never Smoker  . Smokeless tobacco: Never Used  Substance Use Topics  . Alcohol use: Yes    Alcohol/week: 15.0 standard drinks    Types: 14 Glasses of wine, 1 Cans of beer per week    Comment: Has wine daily, occasional beer and liquor  . Drug use: No     Family Hx: The patient's family history includes Cancer in her maternal grandfather; Depression in her mother; Diabetes in her mother; Early death in her paternal grandfather; Hearing loss in her father; Hyperlipidemia in her maternal grandfather; Miscarriages / [place] in her mother; Stroke in her father and paternal grandmother.  ROS:   Please see the history of present illness.     All other systems reviewed and are negative.   Prior CV studies:   The following studies were reviewed today:    Labs/Other Tests and Data Reviewed:    EKG:  No ECG reviewed.  Recent Labs: 03/01/2018: ALT 13 09/10/2018: BUN 17; Creatinine, Ser 0.80; Hemoglobin 13.6; Platelets 269; Potassium 3.7; Sodium  138; TSH 2.855   Recent Lipid Panel Lab Results  Component Value Date/Time   CHOL 181 03/01/2018 10:38 AM   TRIG 52.0 03/01/2018 10:38 AM   HDL 92.70 03/01/2018 10:38 AM   CHOLHDL 2 03/01/2018 10:38 AM   LDLCALC 78 03/01/2018 10:38 AM    Wt Readings from Last 3 Encounters:  11/06/18 135 lb (61.2 kg)  10/15/18 133 lb 9.6 oz (60.6 kg)  09/18/18 140 lb (63.5 kg)     Objective:    Vital Signs:  BP 138/80 (BP Location: Left Arm, Patient Position: Sitting, Cuff Size: Normal)   Pulse 71   Ht 5\' 3"  (1.6 m)   Wt 135 lb (61.2 kg)   BMI 23.91 kg/m    VITAL SIGNS:  reviewed GEN:  no acute distress EYES:  sclerae anicteric,  EOMI - Extraocular Movements Intact RESPIRATORY:  normal respiratory effort, symmetric expansion CARDIOVASCULAR:  no peripheral edema SKIN:  no rash, lesions or ulcers. MUSCULOSKELETAL:  no obvious deformities. NEURO:  alert and oriented x 3, no obvious focal deficit PSYCH:  normal affect  ASSESSMENT & PLAN:    1. Palpitations:   Still having symptoms of intermittent palpitations. Occasionally associated with lightheadedness.   Symptoms sound like volume depletion.   BP has been elevated and we have added HCTZ and potassium ( which may be worsening her volume depletion.   Have asked her to try adding a V-8 to her diet on occasion - especially when she is feeling the palpitations   TSH was normal in Feb. 2020  Some of her symptoms may be related to an underlying collagen vascular disease - Lupus, sjogrens, sclera derma, etc.   Will keep those in mind if we are unable to find a clear diagnosis to explain the symptoms   COVID-19 Education: The signs and symptoms of COVID-19 were discussed with the patient and how to seek care for testing (follow up with PCP or arrange E-visit).  The importance of social distancing was discussed today.  Time:   Today, I have spent  20  minutes with the patient with telehealth technology discussing the above problems.     Medication Adjustments/Labs and Tests Ordered: Current medicines are reviewed at length with the patient today.  Concerns regarding medicines are outlined above.   Tests Ordered: Orders Placed This Encounter  Procedures  . ECHOCARDIOGRAM COMPLETE    Medication Changes: No orders of the defined types were placed in this encounter.   Disposition:  Follow up in 3 month(s)  Signed, Mertie Moores, MD  11/06/2018 10:08 AM    [facility]

## 2018-11-13 ENCOUNTER — Ambulatory Visit: Payer: PRIVATE HEALTH INSURANCE | Admitting: Interventional Cardiology

## 2018-11-21 ENCOUNTER — Other Ambulatory Visit: Payer: Self-pay

## 2018-11-23 ENCOUNTER — Ambulatory Visit (HOSPITAL_COMMUNITY): Payer: No Typology Code available for payment source | Attending: Cardiology

## 2018-11-23 ENCOUNTER — Other Ambulatory Visit: Payer: Self-pay

## 2018-11-23 DIAGNOSIS — I119 Hypertensive heart disease without heart failure: Secondary | ICD-10-CM | POA: Insufficient documentation

## 2018-11-23 DIAGNOSIS — R002 Palpitations: Secondary | ICD-10-CM | POA: Insufficient documentation

## 2019-02-26 ENCOUNTER — Other Ambulatory Visit: Payer: Self-pay | Admitting: Physician Assistant

## 2019-03-11 ENCOUNTER — Other Ambulatory Visit: Payer: Self-pay

## 2019-03-11 ENCOUNTER — Encounter: Payer: Self-pay | Admitting: Physician Assistant

## 2019-03-11 ENCOUNTER — Ambulatory Visit (INDEPENDENT_AMBULATORY_CARE_PROVIDER_SITE_OTHER): Payer: PRIVATE HEALTH INSURANCE | Admitting: Physician Assistant

## 2019-03-11 VITALS — BP 120/70 | HR 56 | Temp 98.3°F | Resp 14 | Ht 63.0 in | Wt 136.0 lb

## 2019-03-11 DIAGNOSIS — Z1239 Encounter for other screening for malignant neoplasm of breast: Secondary | ICD-10-CM

## 2019-03-11 DIAGNOSIS — Z0001 Encounter for general adult medical examination with abnormal findings: Secondary | ICD-10-CM | POA: Diagnosis not present

## 2019-03-11 DIAGNOSIS — G8929 Other chronic pain: Secondary | ICD-10-CM | POA: Diagnosis not present

## 2019-03-11 DIAGNOSIS — I10 Essential (primary) hypertension: Secondary | ICD-10-CM | POA: Diagnosis not present

## 2019-03-11 DIAGNOSIS — L309 Dermatitis, unspecified: Secondary | ICD-10-CM | POA: Insufficient documentation

## 2019-03-11 DIAGNOSIS — L308 Other specified dermatitis: Secondary | ICD-10-CM

## 2019-03-11 DIAGNOSIS — R079 Chest pain, unspecified: Secondary | ICD-10-CM | POA: Diagnosis not present

## 2019-03-11 DIAGNOSIS — E039 Hypothyroidism, unspecified: Secondary | ICD-10-CM | POA: Diagnosis not present

## 2019-03-11 DIAGNOSIS — Z1211 Encounter for screening for malignant neoplasm of colon: Secondary | ICD-10-CM | POA: Diagnosis not present

## 2019-03-11 DIAGNOSIS — Z0000 Encounter for general adult medical examination without abnormal findings: Secondary | ICD-10-CM

## 2019-03-11 DIAGNOSIS — Z23 Encounter for immunization: Secondary | ICD-10-CM

## 2019-03-11 LAB — CBC WITH DIFFERENTIAL/PLATELET
Basophils Absolute: 0 10*3/uL (ref 0.0–0.1)
Basophils Relative: 0.8 % (ref 0.0–3.0)
Eosinophils Absolute: 0 10*3/uL (ref 0.0–0.7)
Eosinophils Relative: 0.8 % (ref 0.0–5.0)
HCT: 39.3 % (ref 36.0–46.0)
Hemoglobin: 13.3 g/dL (ref 12.0–15.0)
Lymphocytes Relative: 29.2 % (ref 12.0–46.0)
Lymphs Abs: 1.6 10*3/uL (ref 0.7–4.0)
MCHC: 33.8 g/dL (ref 30.0–36.0)
MCV: 95.5 fl (ref 78.0–100.0)
Monocytes Absolute: 0.5 10*3/uL (ref 0.1–1.0)
Monocytes Relative: 8.6 % (ref 3.0–12.0)
Neutro Abs: 3.4 10*3/uL (ref 1.4–7.7)
Neutrophils Relative %: 60.6 % (ref 43.0–77.0)
Platelets: 271 10*3/uL (ref 150.0–400.0)
RBC: 4.11 Mil/uL (ref 3.87–5.11)
RDW: 13.1 % (ref 11.5–15.5)
WBC: 5.6 10*3/uL (ref 4.0–10.5)

## 2019-03-11 LAB — SEDIMENTATION RATE: Sed Rate: 12 mm/hr (ref 0–30)

## 2019-03-11 MED ORDER — TRIAMCINOLONE ACETONIDE 0.1 % EX CREA: 1.0000 "application " | TOPICAL_CREAM | Freq: Two times a day (BID) | CUTANEOUS | 0 refills | Status: DC

## 2019-03-11 NOTE — Patient Instructions (Signed)
Please go to the lab today for blood work.  I will call you with your results. We will alter treatment regimen(s) if indicated by your results.   You will be contacted for a screening mammogram and a screening colonoscopy.   Please make sure to schedule follow-up with Dr. Cathie Olden.    Preventive Care 50-50 Years Old, Female Preventive care refers to visits with your health care provider and lifestyle choices that can promote health and wellness. This includes:  A yearly physical exam. This may also be called an annual well check.  Regular dental visits and eye exams.  Immunizations.  Screening for certain conditions.  Healthy lifestyle choices, such as eating a healthy diet, getting regular exercise, not using drugs or products that contain nicotine and tobacco, and limiting alcohol use. What can I expect for my preventive care visit? Physical exam Your health care provider will check your:  Height and weight. This may be used to calculate body mass index (BMI), which tells if you are at a healthy weight.  Heart rate and blood pressure.  Skin for abnormal spots. Counseling Your health care provider may ask you questions about your:  Alcohol, tobacco, and drug use.  Emotional well-being.  Home and relationship well-being.  Sexual activity.  Eating habits.  Work and work Statistician.  Method of birth control.  Menstrual cycle.  Pregnancy history. What immunizations do I need?  Influenza (flu) vaccine  This is recommended every year. Tetanus, diphtheria, and pertussis (Tdap) vaccine  You may need a Td booster every 10 years. Varicella (chickenpox) vaccine  You may need this if you have not been vaccinated. Zoster (shingles) vaccine  You may need this after age 50. Measles, mumps, and rubella (MMR) vaccine  You may need at least one dose of MMR if you were born in 1957 or later. You may also need a second dose. Pneumococcal conjugate (PCV13) vaccine  You  may need this if you have certain conditions and were not previously vaccinated. Pneumococcal polysaccharide (PPSV23) vaccine  You may need one or two doses if you smoke cigarettes or if you have certain conditions. Meningococcal conjugate (MenACWY) vaccine  You may need this if you have certain conditions. Hepatitis A vaccine  You may need this if you have certain conditions or if you travel or work in places where you may be exposed to hepatitis A. Hepatitis B vaccine  You may need this if you have certain conditions or if you travel or work in places where you may be exposed to hepatitis B. Haemophilus influenzae type b (Hib) vaccine  You may need this if you have certain conditions. Human papillomavirus (HPV) vaccine  If recommended by your health care provider, you may need three doses over 6 months. You may receive vaccines as individual doses or as more than one vaccine together in one shot (combination vaccines). Talk with your health care provider about the risks and benefits of combination vaccines. What tests do I need? Blood tests  Lipid and cholesterol levels. These may be checked every 5 years, or more frequently if you are over 38 years old.  Hepatitis C test.  Hepatitis B test. Screening  Lung cancer screening. You may have this screening every year starting at age 50 if you have a 30-pack-year history of smoking and currently smoke or have quit within the past 15 years.  Colorectal cancer screening. All adults should have this screening starting at age 50 and continuing until age 50. Your health care  provider may recommend screening at age 50 if you are at increased risk. You will have tests every 1-10 years, depending on your results and the type of screening test.  Diabetes screening. This is done by checking your blood sugar (glucose) after you have not eaten for a while (fasting). You may have this done every 1-3 years.  Mammogram. This may be done every 1-2  years. Talk with your health care provider about when you should start having regular mammograms. This may depend on whether you have a family history of breast cancer.  BRCA-related cancer screening. This may be done if you have a family history of breast, ovarian, tubal, or peritoneal cancers.  Pelvic exam and Pap test. This may be done every 3 years starting at age 50. Starting at age 50, this may be done every 5 years if you have a Pap test in combination with an HPV test. Other tests  Sexually transmitted disease (STD) testing.  Bone density scan. This is done to screen for osteoporosis. You may have this scan if you are at high risk for osteoporosis. Follow these instructions at home: Eating and drinking  Eat a diet that includes fresh fruits and vegetables, whole grains, lean protein, and low-fat dairy.  Take vitamin and mineral supplements as recommended by your health care provider.  Do not drink alcohol if: ? Your health care provider tells you not to drink. ? You are pregnant, may be pregnant, or are planning to become pregnant.  If you drink alcohol: ? Limit how much you have to 0-1 drink a day. ? Be aware of how much alcohol is in your drink. In the [place] one drink equals one 12 oz bottle of beer (355 mL), one 5 oz glass of wine (148 mL), or one 1 oz glass of hard liquor (44 mL). Lifestyle  Take daily care of your teeth and gums.  Stay active. Exercise for at least 30 minutes on 5 or more days each week.  Do not use any products that contain nicotine or tobacco, such as cigarettes, e-cigarettes, and chewing tobacco. If you need help quitting, ask your health care provider.  If you are sexually active, practice safe sex. Use a condom or other form of birth control (contraception) in order to prevent pregnancy and STIs (sexually transmitted infections).  If told by your health care provider, take low-dose aspirin daily starting at age 60. What's next?  Visit your  health care provider once a year for a well check visit.  Ask your health care provider how often you should have your eyes and teeth checked.  Stay up to date on all vaccines. This information is not intended to replace advice given to you by your health care provider. Make sure you discuss any questions you have with your health care provider. Document Released: 07/03/2015 Document Revised: 02/15/2018 Document Reviewed: 02/15/2018 Elsevier Patient Education  2020 [employer].

## 2019-03-11 NOTE — Progress Notes (Signed)
Patient presents to clinic today for annual exam.  Patient is fasting for labs.  Acute Concerns: Patient endorses issue with significant eczema outbreak of her hands bilaterally x the past few months which she feels is related to the amount of hand washing and hand sanitizer she is having to use on a daily basis. Has used OTC moisturizers.  Chronic Issues: Hypothyroidism -- Patient is currently on a regimen of levothyroxine 100 mcg daily. Endorses taking medications as directed.   Lab Results  Component Value Date   TSH 2.855 09/10/2018   Hypertension -- Managed by Cardiology. Is currently on a regimen of Valsartan 80 mg QD and HCTZ 25 mg QD. Endorses taking medications as directed. Patient denies chest pain, palpitations, lightheadedness, dizziness, vision changes or frequent headaches.  BP Readings from Last 3 Encounters:  11/06/18 138/80  10/15/18 117/84  09/18/18 130/86   Health Maintenance: Immunizations -- Agrees to flu shot.  Colonoscopy -- Agrees to screening. Average risk. Denies symptoms.  Mammogram -- Due. Denies history of abnormal mammogram.   Past Medical History:  Diagnosis Date  . Chicken pox   . Migraines   . Thyroid disease     Past Surgical History:  Procedure Laterality Date  . CERVIX SURGERY  1992  . CLAVICLE SURGERY  2006    Current Outpatient Medications on File Prior to Visit  Medication Sig Dispense Refill  . hydrochlorothiazide (HYDRODIURIL) 25 MG tablet Take 1 tablet (25 mg total) by mouth daily. 30 tablet 11  . levothyroxine (SYNTHROID) 100 MCG tablet TAKE 1 TABLET (100 MCG TOTAL) BY MOUTH DAILY BEFORE BREAKFAST. 90 tablet 1  . Multiple Vitamins-Minerals (MULTIVITAMIN ADULT PO) Take 1 tablet by mouth daily.    . potassium chloride (K-DUR) 10 MEQ tablet Take 1 tablet (10 mEq total) by mouth daily. 30 tablet 11  . valsartan (DIOVAN) 80 MG tablet Take 1 tablet (80 mg total) by mouth daily. 90 tablet 3   No current facility-administered  medications on file prior to visit.     Allergies  Allergen Reactions  . Other Swelling and Other (See Comments)    TREE NUTS make patient's throat "tight," but her breathing doesn't become impaired (I asked)    Family History  Problem Relation Age of Onset  . Depression Mother   . Diabetes Mother   . Miscarriages / [place] Mother   . Hearing loss Father   . Stroke Father   . Cancer Maternal Grandfather   . Hyperlipidemia Maternal Grandfather   . Stroke Paternal Grandmother   . Early death Paternal Grandfather        Polio    Social History   Socioeconomic History  . Marital status: Married    Spouse name: Not on file  . Number of children: Not on file  . Years of education: Not on file  . Highest education level: Not on file  Occupational History  . Not on file  Social Needs  . Financial resource strain: Not on file  . Food insecurity    Worry: Not on file    Inability: Not on file  . Transportation needs    Medical: Not on file    Non-medical: Not on file  Tobacco Use  . Smoking status: Never Smoker  . Smokeless tobacco: Never Used  Substance and Sexual Activity  . Alcohol use: Yes    Alcohol/week: 15.0 standard drinks    Types: 14 Glasses of wine, 1 Cans of beer per week  Comment: Has wine daily, occasional beer and liquor  . Drug use: No  . Sexual activity: Yes  Lifestyle  . Physical activity    Days per week: Not on file    Minutes per session: Not on file  . Stress: Not on file  Relationships  . Social Herbalist on phone: Not on file    Gets together: Not on file    Attends religious service: Not on file    Active member of club or organization: Not on file    Attends meetings of clubs or organizations: Not on file    Relationship status: Not on file  . Intimate partner violence    Fear of current or ex partner: Not on file    Emotionally abused: Not on file    Physically abused: Not on file    Forced sexual activity: Not on  file  Other Topics Concern  . Not on file  Social History Narrative  . Not on file    Review of Systems  Constitutional: Negative for fever and weight loss.  HENT: Negative for ear discharge, ear pain, hearing loss and tinnitus.   Eyes: Negative for blurred vision, double vision, photophobia and pain.  Respiratory: Negative for cough and shortness of breath.   Cardiovascular: Negative for chest pain and palpitations.  Gastrointestinal: Negative for abdominal pain, blood in stool, constipation, diarrhea, heartburn, melena, nausea and vomiting.  Genitourinary: Negative for dysuria, flank pain, frequency, hematuria and urgency.  Musculoskeletal: Negative for falls.  Neurological: Negative for dizziness, loss of consciousness and headaches.  Endo/Heme/Allergies: Negative for environmental allergies.  Psychiatric/Behavioral: Negative for depression, hallucinations, substance abuse and suicidal ideas. The patient is not nervous/anxious and does not have insomnia.    Resp 14   Ht 5\' 3"  (1.6 m)   Wt 136 lb (61.7 kg)   LMP 02/13/2019   BMI 24.09 kg/m   Physical Exam Vitals signs reviewed.  HENT:     Head: Normocephalic and atraumatic.     Right Ear: Tympanic membrane, ear canal and external ear normal.     Left Ear: Tympanic membrane, ear canal and external ear normal.     Nose: Nose normal. No mucosal edema.     Mouth/Throat:     Pharynx: Uvula midline. No oropharyngeal exudate or posterior oropharyngeal erythema.  Eyes:     Conjunctiva/sclera: Conjunctivae normal.     Pupils: Pupils are equal, round, and reactive to light.  Neck:     Musculoskeletal: Neck supple.     Thyroid: No thyromegaly.  Cardiovascular:     Rate and Rhythm: Normal rate and regular rhythm.     Heart sounds: Normal heart sounds.  Pulmonary:     Effort: Pulmonary effort is normal. No respiratory distress.     Breath sounds: Normal breath sounds. No wheezing or rales.  Abdominal:     General: Bowel sounds  are normal. There is no distension.     Palpations: Abdomen is soft. There is no mass.     Tenderness: There is no abdominal tenderness. There is no guarding or rebound.  Lymphadenopathy:     Cervical: No cervical adenopathy.  Skin:    General: Skin is warm and dry.     Findings: No rash.  Neurological:     Mental Status: She is alert and oriented to person, place, and time.     Cranial Nerves: No cranial nerve deficit.    Assessment/Plan: 1. Visit for preventive health examination Depression screen  negative. Health Maintenance reviewed. Preventive schedule discussed and handout given in AVS. Will obtain fasting labs today.  - CBC with Differential/Platelet - Comprehensive metabolic panel - Hemoglobin A1c - Lipid panel  2. Breast cancer screening Average risk. Asymptomatic. Order for screening mammogram placed. - MM DIGITAL SCREENING BILATERAL; Future  3. Colon cancer screening Average risk. Asymptomatic. Agrees to colonoscopy. Referral to GI placed. - Ambulatory referral to Gastroenterology  4. Hypothyroidism, unspecified type Taking levothyroxine as directed. Repeat labs today. - TSH  5. Essential hypertension Taking medications as directed. Continue management per Cardiology. Repeat labs todayl - Comprehensive metabolic panel - Hemoglobin A1c - Lipid panel  6. Other eczema Hands bilateral. Supportive measures and OTC moisturizers reviewed. Rx Kenalog cream 0.1%  7. Chronic chest pain Negative Cardiac workup. Sternal. Much improved. Cardiology recommended assessment for AI cause. Will check screening labs today.  - Sedimentation rate - Antinuclear Antib (ANA)   Leeanne Rio, PA-C

## 2019-03-12 ENCOUNTER — Encounter: Payer: Self-pay | Admitting: Gastroenterology

## 2019-03-12 ENCOUNTER — Ambulatory Visit
Admission: RE | Admit: 2019-03-12 | Discharge: 2019-03-12 | Disposition: A | Discharge status: Home / Self Care | Payer: PRIVATE HEALTH INSURANCE | Source: Ambulatory Visit | Attending: Physician Assistant | Admitting: Physician Assistant

## 2019-03-12 DIAGNOSIS — Z1239 Encounter for other screening for malignant neoplasm of breast: Secondary | ICD-10-CM

## 2019-03-12 LAB — COMPREHENSIVE METABOLIC PANEL
ALT: 14 U/L (ref 0–35)
AST: 24 U/L (ref 0–37)
Albumin: 4.7 g/dL (ref 3.5–5.2)
Alkaline Phosphatase: 30 U/L — ABNORMAL LOW (ref 39–117)
BUN: 17 mg/dL (ref 6–23)
CO2: 25 mEq/L (ref 19–32)
Calcium: 9.8 mg/dL (ref 8.4–10.5)
Chloride: 99 mEq/L (ref 96–112)
Creatinine, Ser: 0.69 mg/dL (ref 0.40–1.20)
GFR: 89.84 mL/min (ref >60.00)
Glucose, Bld: 108 mg/dL — ABNORMAL HIGH (ref 70–99)
Potassium: 3.7 mEq/L (ref 3.5–5.1)
Sodium: 136 mEq/L (ref 135–145)
Total Bilirubin: 0.8 mg/dL (ref 0.2–1.2)
Total Protein: 7.1 g/dL (ref 6.0–8.3)

## 2019-03-12 LAB — LIPID PANEL
Cholesterol: 191 mg/dL (ref 0–200)
HDL: 97.7 mg/dL (ref >39.00)
LDL Cholesterol: 85 mg/dL (ref 0–99)
NonHDL: 93.58
Total CHOL/HDL Ratio: 2
Triglycerides: 44 mg/dL (ref 0.0–149.0)
VLDL: 8.8 mg/dL (ref 0.0–40.0)

## 2019-03-12 LAB — ANA: Anti Nuclear Antibody (ANA): NEGATIVE

## 2019-03-12 LAB — HEMOGLOBIN A1C: Hgb A1c MFr Bld: 5.6 % (ref 4.6–6.5)

## 2019-03-12 LAB — TSH: TSH: 0.81 u[IU]/mL (ref 0.35–4.50)

## 2019-03-26 ENCOUNTER — Encounter: Payer: Self-pay | Admitting: Physician Assistant

## 2019-03-26 ENCOUNTER — Telehealth (INDEPENDENT_AMBULATORY_CARE_PROVIDER_SITE_OTHER): Payer: PRIVATE HEALTH INSURANCE | Admitting: Physician Assistant

## 2019-03-26 ENCOUNTER — Other Ambulatory Visit: Payer: Self-pay

## 2019-03-26 VITALS — BP 138/81 | HR 60 | Ht 63.0 in | Wt 138.0 lb

## 2019-03-26 DIAGNOSIS — R42 Dizziness and giddiness: Secondary | ICD-10-CM

## 2019-03-26 DIAGNOSIS — R0789 Other chest pain: Secondary | ICD-10-CM | POA: Diagnosis not present

## 2019-03-26 DIAGNOSIS — I10 Essential (primary) hypertension: Secondary | ICD-10-CM

## 2019-03-26 DIAGNOSIS — R002 Palpitations: Secondary | ICD-10-CM

## 2019-03-26 NOTE — Patient Instructions (Addendum)
Medication Instructions:  Your physician recommends that you continue on your current medications as directed. Please refer to the Current Medication list given to you today.  If you need a refill on your cardiac medications before your next appointment, please call your pharmacy.   Lab work: None   If you have labs (blood work) drawn today and your tests are completely normal, you will receive your results only by: Marland Kitchen MyChart Message (if you have MyChart) OR . A paper copy in the mail If you have any lab test that is abnormal or we need to change your treatment, we will call you to review the results.  Testing/Procedures: Your physician has requested that you have an exercise tolerance test. For further information please visit [email or web address]. Please also follow instruction sheet, as given.   Follow-Up: You are scheduled to see [name] on 07/03/2019 @ 10:15 AM  Any Other Special Instructions Will Be Listed Below (If Applicable).  [facility] [address] [place], [place] [address] Phone:  [phone]  March 26, 2019      Theresa Raymond DOB: Dec 19, 1970 MRN: [id number] [place] [address]   Dear Theresa Raymond,   You are scheduled for an Exercise Stress Test on ______ at ________-  Please arrive 15 minutes prior to your appointment time for registration and insurance purposes.  The test will take approximately 45 minutes to complete.  How to prepare for your Exercise Stress Test:  Do bring a list of your current medications with you.  If not listed below, you may take your medications as normal.  Do wear comfortable clothes (no dresses or overalls) and walking shoes, tennis shoes preferred (no heels or open toed shoes are allowed)  Do Not wear cologne, perfume, aftershave or lotions (deodorant is allowed).  Please report to [place], [address] for your test.  If  these instructions are not followed, your test will have to be rescheduled.  If you have questions or concerns about your appointment, you can call the Stress Lab at [phone].  If you cannot keep your appointment, please provide 24 hours notification to the Stress Lab, to avoid a possible $50 charge to your account.  [facility] [address] [id number]                    1

## 2019-03-26 NOTE — Progress Notes (Signed)
Virtual Visit via Telephone Note   This visit type was conducted due to national recommendations for restrictions regarding the COVID-19 Pandemic (e.g. social distancing) in an effort to limit this patient's exposure and mitigate transmission in our community.  Due to her co-morbid illnesses, this patient is at least at moderate risk for complications without adequate follow up.  This format is felt to be most appropriate for this patient at this time.  The patient did not have access to video technology/had technical difficulties with video requiring transitioning to audio format only (telephone).  All issues noted in this document were discussed and addressed.  No physical exam could be performed with this format.  Please refer to the patient's chart for her  consent to telehealth for Theresa Raymond.   Date:  03/26/2019   ID:  Theresa Raymond, DOB March 24, 1971, MRN [id number]  Patient Location: Home Provider Location: Home  PCP:  Brunetta Jeans, PA-C  Cardiologist:  Mertie Moores, MD   Electrophysiologist:  None   Evaluation Performed:  Follow-Up Visit  Chief Complaint:  Chest pain, dizziness  History of Present Illness:    Theresa Raymond is a 48 y.o. female with:  Hypertension  Chest pain  Palpitations  She was initially seen for chest pain in March 2020.  Chest CT was negative for dissection.  She typically runs 15-20 miles per week.  She was last seen by Dr. Acie Fredrickson in May 2020.  She continued to have symptoms of palpitations.  An echocardiogram was obtained and demonstrated normal LV function.  An event monitor demonstrated sinus rhythm and no significant arrhythmias.   She is an avid runner and has run marathons in the past.  She was in her usual state of health until March 2020.  Of note, she travels quite a bit for her job.  In late March, shortly after returning from a trip to [place], she started to have chest tightness and fatigue.  She also had a cough but never had  a fever.  She was never tested for COVID.  She did go to the emergency room and had a CT scan that was negative for dissection.  Since March she has continued to feel episodic lightheadedness.  She has had episodes of near syncope.  She has not had frank syncope.  She can run often without issues.  She will feel lightheaded with activities that are not strenuous (such as walking her dog).  She often has sharp chest discomfort that may last seconds or 1 to 2 minutes.  Sometimes it happens towards the end of the run and she has to stop.  She has not really noticed significant shortness of breath.  She has not had orthopnea or lower extremity swelling.  The patient does not have symptoms concerning for COVID-19 infection (fever, chills, cough, or new shortness of breath).    Past Medical History:  Diagnosis Date  . Chicken pox   . Migraines   . Thyroid disease    Past Surgical History:  Procedure Laterality Date  . CERVIX SURGERY  1992  . CLAVICLE SURGERY  2006     Current Meds  Medication Sig  . hydrochlorothiazide (HYDRODIURIL) 25 MG tablet Take 1 tablet (25 mg total) by mouth daily.  Marland Kitchen levothyroxine (SYNTHROID) 100 MCG tablet TAKE 1 TABLET (100 MCG TOTAL) BY MOUTH DAILY BEFORE BREAKFAST.  . Multiple Vitamins-Minerals (MULTIVITAMIN ADULT PO) Take 1 tablet by mouth daily.  . potassium chloride (K-DUR) 10 MEQ tablet Take 1 tablet (  10 mEq total) by mouth daily.  Marland Kitchen triamcinolone cream (KENALOG) 0.1 % Apply 1 application topically 2 (two) times daily.  . valsartan (DIOVAN) 80 MG tablet Take 1 tablet (80 mg total) by mouth daily.     Allergies:   Other   Social History   Tobacco Use  . Smoking status: Never Smoker  . Smokeless tobacco: Never Used  Substance Use Topics  . Alcohol use: Yes    Alcohol/week: 15.0 standard drinks    Types: 14 Glasses of wine, 1 Cans of beer per week    Comment: Has wine daily, occasional beer and liquor  . Drug use: No     Family Hx: The patient's  family history includes Cancer in her maternal grandfather; Depression in her mother; Diabetes in her mother; Early death in her paternal grandfather; Hearing loss in her father; Hyperlipidemia in her maternal grandfather; Miscarriages / [place] in her mother; Stroke in her father and paternal grandmother.  ROS:   Please see the history of present illness.    She has not had recent fever, cough, wheezing, bleeding issues, joint pain or skin changes. All other systems reviewed and are negative.   Prior CV studies:   The following studies were reviewed today:  Echocardiogram 11/23/2018 EF 60-65, mild LVH, normal diastolic function, normal RV SF, mild to moderate TR, mild calcification of aortic valve  Event monitor 11/2018  Sinus rhythm - including NSR , sinus bradycardia and episodes of sinus tachycardia  No significant arrhythmias.   Labs/Other Tests and Data Reviewed:    EKG:  No ECG reviewed.  Recent Labs: 03/11/2019: ALT 14; BUN 17; Creatinine, Ser 0.69; Hemoglobin 13.3; Platelets 271.0; Potassium 3.7; Sodium 136; TSH 0.81   Recent Lipid Panel Lab Results  Component Value Date/Time   CHOL 191 03/11/2019 02:53 PM   TRIG 44.0 03/11/2019 02:53 PM   HDL 97.70 03/11/2019 02:53 PM   CHOLHDL 2 03/11/2019 02:53 PM   [place] 85 03/11/2019 02:53 PM    Wt Readings from Last 3 Encounters:  03/26/19 138 lb (62.6 kg)  03/11/19 136 lb (61.7 kg)  11/06/18 135 lb (61.2 kg)     Objective:    Vital Signs:  BP 138/81   Pulse 60   Ht _0  (1.6 m)   Wt 138 lb (62.6 kg)   BMI 24.45 kg/m    VITAL SIGNS:  reviewed GEN:  no acute distress RESPIRATORY:  No labored breathing NEURO:  Alert and oriented PSYCH:  Normal mood  ASSESSMENT & PLAN:    1. Other chest pain 2. Dizziness She has had episodic chest discomfort and dizziness for the past 7 months.  Of note, she had a cough and fatigue but no fever shortly after returning from a trip to [place] in March 2020.  Of note, she  visited her son while there.  He is a Pharmacist, [employer].  Shortly after returning home, her son noted that there were several people at the school that tested positive for COVID.  She recently saw her primary care provider.  ANA and ESR were both normal.  She has fairly atypical chest pain and is able to exercise, for the most part, without symptoms.  Her father did have coronary artery disease diagnosed in his 68s.  Given her family history, I think it is important for [place] to go ahead and rule out the possibility of ischemic heart disease.  Also, I will discuss with Dr. Acie Fredrickson whether or not we should try to investigate  COVID as a potential cause for her symptoms.  Thus far, I have not been able to find any reported residual symptoms such as hers (other than chest pain) related to [place].  -Arrange exercise tolerance test  -Follow-up with Dr. Acie Fredrickson or me in 3 months.  3. Palpitations Recent event monitor without arrhythmia.  4. Essential hypertension Fair control.  Continue current therapy.  Continue to monitor.   Time:   Today, I have spent 21 minutes with the patient with telehealth technology discussing the above problems.     Medication Adjustments/Labs and Tests Ordered: Current medicines are reviewed at length with the patient today.  Concerns regarding medicines are outlined above.   Tests Ordered: Orders Placed This Encounter  Procedures  . Exercise Tolerance Test    Medication Changes: No orders of the defined types were placed in this encounter.   Follow Up:  In Person in 3 month(s)  Signed, Richardson Dopp, PA-C  03/26/2019 4:06 PM    [facility]

## 2019-03-27 ENCOUNTER — Telehealth: Payer: PRIVATE HEALTH INSURANCE | Admitting: Cardiovascular Disease

## 2019-04-02 ENCOUNTER — Other Ambulatory Visit: Payer: Self-pay

## 2019-04-02 ENCOUNTER — Ambulatory Visit (AMBULATORY_SURGERY_CENTER): Payer: Self-pay | Admitting: *Deleted

## 2019-04-02 DIAGNOSIS — Z1211 Encounter for screening for malignant neoplasm of colon: Secondary | ICD-10-CM

## 2019-04-02 NOTE — Progress Notes (Signed)
Pt came in to [facility] today- she told me since March 2020 she has been having Chest pain- she had a  Heart monitor x 1 month, she had an echo that was normal,EF 60-65%, No AVS , leaking Tricuspid Valve- but she is still having chest pain- she has near syncopal episodes with exercise-  She is scheduled for a stress test 10-29 Thursday   And depending on these results she may have further testing-  She is scheduled here for a screening colon- she had cough and extreme fatigue in March 2020 but no fever so she was never tested for [place] may do antibody testing in the future- she will have a covid nasal swab Monday 10-26-prior to her stress test -- I explained to her and her husband that we need complete cardiac clearance before we do a screening Colonoscopy - pt and husband agree- I explained to them once she has cardiac clearance completely , cal back and Reschedule the colonoscopy.  Both verbalized understanding

## 2019-04-15 ENCOUNTER — Other Ambulatory Visit: Payer: Self-pay | Admitting: Physician Assistant

## 2019-04-15 ENCOUNTER — Other Ambulatory Visit (HOSPITAL_COMMUNITY)
Admission: RE | Admit: 2019-04-15 | Discharge: 2019-04-15 | Disposition: A | Discharge status: Home / Self Care | Payer: PRIVATE HEALTH INSURANCE | Source: Ambulatory Visit | Attending: Physician Assistant | Admitting: Physician Assistant

## 2019-04-15 DIAGNOSIS — Z20828 Contact with and (suspected) exposure to other viral communicable diseases: Secondary | ICD-10-CM | POA: Insufficient documentation

## 2019-04-15 DIAGNOSIS — Z01812 Encounter for preprocedural laboratory examination: Secondary | ICD-10-CM | POA: Diagnosis present

## 2019-04-16 ENCOUNTER — Encounter: Payer: PRIVATE HEALTH INSURANCE | Admitting: Gastroenterology

## 2019-04-16 ENCOUNTER — Telehealth (HOSPITAL_COMMUNITY): Payer: Self-pay

## 2019-04-16 LAB — NOVEL CORONAVIRUS, NAA (HOSP ORDER, SEND-OUT TO REF LAB; TAT 18-24 HRS): SARS-CoV-2, NAA: NOT DETECTED

## 2019-04-16 NOTE — Telephone Encounter (Signed)
Encounter complete. 

## 2019-04-18 ENCOUNTER — Ambulatory Visit (HOSPITAL_COMMUNITY)
Admission: RE | Admit: 2019-04-18 | Discharge: 2019-04-18 | Disposition: A | Discharge status: Home / Self Care | Payer: Managed Care, Other (non HMO) | Source: Ambulatory Visit | Attending: Cardiology | Admitting: Cardiology

## 2019-04-18 ENCOUNTER — Other Ambulatory Visit: Payer: Self-pay

## 2019-04-18 DIAGNOSIS — R0789 Other chest pain: Secondary | ICD-10-CM | POA: Diagnosis present

## 2019-04-18 LAB — EXERCISE TOLERANCE TEST
Estimated workload: 15.3 METS
Exercise duration (min): 13 min
Exercise duration (sec): 1 s
MPHR: 170 {beats}/min
Peak HR: 176 {beats}/min
Percent HR: 103 %
Rest HR: 64 {beats}/min

## 2019-04-19 ENCOUNTER — Encounter: Payer: Self-pay | Admitting: Physician Assistant

## 2019-04-22 ENCOUNTER — Telehealth: Payer: Self-pay

## 2019-04-22 ENCOUNTER — Encounter: Payer: Self-pay | Admitting: Nurse Practitioner

## 2019-04-22 DIAGNOSIS — R42 Dizziness and giddiness: Secondary | ICD-10-CM

## 2019-04-22 DIAGNOSIS — R079 Chest pain, unspecified: Secondary | ICD-10-CM

## 2019-04-22 DIAGNOSIS — R0789 Other chest pain: Secondary | ICD-10-CM

## 2019-04-22 MED ORDER — METOPROLOL TARTRATE 50 MG PO TABS: ORAL_TABLET | ORAL | 0 refills | Status: DC

## 2019-04-22 NOTE — Telephone Encounter (Signed)
I called and spoke with patient, she is aware of stress test results. Patient will go this week to have COVID-19 antibody testing this week, orders are in. Patient is concerned that she is still having dizziness and some chest pain with exercising. She wants to make sure she is ok to exercise and run. She is scared to do exercise and drive due to dizziness, she doesn't want to pass out.

## 2019-04-22 NOTE — Addendum Note (Signed)
Addended by: Emmaline Life on: 04/22/2019 03:25 PM   Modules accepted: Orders

## 2019-04-22 NOTE — Telephone Encounter (Signed)
I called and spoke with patient, she is aware of stress test results. Patient will go this week to have COVID-19 antibody testing this week, orders are in.

## 2019-04-22 NOTE — Telephone Encounter (Signed)
Spoke with patient and reviewed Dr. Elmarie Shiley advice with her. She verbalized understanding and agreement to proceed with plan for coronary CT. We discussed the timing of her symptoms in association with exercise and her medicine regimen. She pays close attention to hydration and electrolyte replacement. She is going to send [place] her BP/pulse recordings. I advised that she may try taking the valsartan in the afternoon or evening instead of in the morning to see how she feels; she verbalized agreement. I advised that she may exercise if she feels comfortable doing so and she agrees to seek emergency medical attention for symptoms that are unusual or worse or for symptoms that do not resolve with rest. She agrees to call back with questions or concerns prior to the CT. She thanked me for the call.

## 2019-04-22 NOTE — Telephone Encounter (Signed)
Pt continues to have CP and lightheadedness with exercise. GXT was unremarkable Echo shows normal LV function with mild TR Lets get a coronary CT angio to look closer at her coronary arteries.  Set up office visit in the next month or so .

## 2019-04-22 NOTE — Telephone Encounter (Signed)
-----   Message from Nuala Alpha, LPN sent at [id number]  1:18 PM EDT -----  ----- Message ----- From: Liliane Shi, PA-C Sent: 04/19/2019   1:05 PM EDT To: [address] Triage  Please call the patient. The stress test is normal.   I did review her case with Dr. Acie Fredrickson and our infectious disease director for [facility].  We think it may be helpful to get a COVID-19 antibody test to determine if she had COVID back in March.  There is a small chance of a false positive.  The good news is, her echocardiogram and EKG were both normal.  So, a positive antibody test would not change her management. But, if she wants to know, we can get it.   PLAN:   - If she would like to get a test, order the SARS-CoV-2 antibody (IgM) test.  - Send Copy to PCP Richardson Dopp, PA-C    04/19/2019 1:00 PM

## 2019-04-24 ENCOUNTER — Other Ambulatory Visit: Payer: Self-pay | Admitting: *Deleted

## 2019-04-24 DIAGNOSIS — R42 Dizziness and giddiness: Secondary | ICD-10-CM

## 2019-04-24 DIAGNOSIS — R079 Chest pain, unspecified: Secondary | ICD-10-CM

## 2019-04-24 DIAGNOSIS — R0789 Other chest pain: Secondary | ICD-10-CM

## 2019-04-25 ENCOUNTER — Telehealth (HOSPITAL_COMMUNITY): Payer: Self-pay | Admitting: Emergency Medicine

## 2019-04-25 NOTE — Telephone Encounter (Signed)
I spoke with pt. She also sent my chart message which has been addressed.

## 2019-04-25 NOTE — Telephone Encounter (Signed)
Received VM from patient asking about orders for covid test and lab work  States she went to [facility] to have tests done and they knew nothing about her orders.

## 2019-05-04 LAB — BASIC METABOLIC PANEL
BUN/Creatinine Ratio: 19 (ref 9–23)
BUN: 14 mg/dL (ref 6–24)
CO2: 24 mmol/L (ref 20–29)
Calcium: 9.7 mg/dL (ref 8.7–10.2)
Chloride: 97 mmol/L (ref 96–106)
Creatinine, Ser: 0.75 mg/dL (ref 0.57–1.00)
GFR calc Af Amer: 107 mL/min/{1.73_m2} (ref >59)
GFR calc non Af Amer: 93 mL/min/{1.73_m2} (ref >59)
Glucose: 151 mg/dL — ABNORMAL HIGH (ref 65–99)
Potassium: 3.9 mmol/L (ref 3.5–5.2)
Sodium: 136 mmol/L (ref 134–144)

## 2019-05-04 LAB — SARS-COV-2 ANTIBODY, IGM: SARS-CoV-2 Antibody, IgM: NEGATIVE

## 2019-05-05 ENCOUNTER — Other Ambulatory Visit: Payer: Self-pay | Admitting: Physician Assistant

## 2019-06-07 ENCOUNTER — Telehealth (HOSPITAL_COMMUNITY): Payer: Self-pay | Admitting: Emergency Medicine

## 2019-06-07 ENCOUNTER — Encounter (HOSPITAL_COMMUNITY): Payer: Self-pay

## 2019-06-07 NOTE — Telephone Encounter (Signed)
Left message on voicemail with name and callback number Ripken Rekowski RN Navigator Cardiac Imaging [facility] [phone] Office [phone] Cell  

## 2019-06-10 ENCOUNTER — Ambulatory Visit
Admission: RE | Admit: 2019-06-10 | Discharge: 2019-06-10 | Disposition: A | Discharge status: Home / Self Care | Payer: Managed Care, Other (non HMO) | Source: Ambulatory Visit | Attending: Cardiovascular Disease | Admitting: Cardiovascular Disease

## 2019-06-10 ENCOUNTER — Other Ambulatory Visit: Payer: Self-pay

## 2019-06-10 DIAGNOSIS — R079 Chest pain, unspecified: Secondary | ICD-10-CM | POA: Insufficient documentation

## 2019-06-10 MED ORDER — NITROGLYCERIN 0.4 MG SL SUBL
0.8000 mg | SUBLINGUAL_TABLET | Freq: Once | SUBLINGUAL | Status: AC
  Administered 2019-06-10: 09:00:00 0.8 mg via SUBLINGUAL

## 2019-06-10 MED ORDER — IOHEXOL 350 MG/ML SOLN
75.0000 mL | Freq: Once | INTRAVENOUS | Status: AC | PRN
  Administered 2019-06-10: 09:00:00 75 mL via INTRAVENOUS

## 2019-06-10 NOTE — Progress Notes (Signed)
Patient tolerated CT well. Did not want anything to eat or drink. Ambulatory steady gait to exit

## 2019-07-02 NOTE — Progress Notes (Signed)
Cardiology Office Note:    Date:  07/03/2019   ID:  Theresa Raymond, DOB February 22, 1971, MRN [id number]  PCP:  Brunetta Jeans, PA-C  Cardiologist:  Mertie Moores, MD  Electrophysiologist:  None   Referring MD: Brunetta Jeans, PA-C   Chief Complaint  Patient presents with  . Follow-up    chest pain    History of Present Illness:    Theresa Raymond is a 49 y.o. female with:   Hypertension  Chest pain  Palpitations  She was initially seen for chest pain in March 2020.  Chest CT was negative for dissection.  She typically runs 15-20 miles per week.  She was last seen by Dr. Acie Fredrickson in May 2020.  She continued to have symptoms of palpitations.  An echocardiogram was obtained and demonstrated normal LV function.  An event monitor demonstrated sinus rhythm and no significant arrhythmias.  [name] was last seen via Telemedicine in 03/2019.  I set her up for a GXT due to ongoing chest pain.  This was unremarkable.  I also got a SARS-CoV-2 antibody test which was negative.  She continued to have chest pain and Dr. Acie Fredrickson arranged a coronary CTA which demonstrated no CAD and a calcium score of 0.  She returns for follow-up.  She is here alone.  She has not had any further chest pain.  She has been trying to run.  She has not had shortness of breath.  She does still have occasional dizziness.  She describes an imbalance but no spinning sensation.    Prior CV studies:   The following studies were reviewed today:   Coronary CTA 06/10/2019 IMPRESSION: 1. Poor quality study with significant motion and misregistration artifact 2.  Calcium score 0 2.  Normal right dominant coronary arteries 4.  Normal aortic root 3.2 cm  GXT 04/18/2019 ETT with good exercise tolerance (13:01); no chest pain; normal blood pressure response (systolic blood pressure documented at 131 in stage IV felt to be inaccurate); no ST changes; negative adequate exercise treadmill; Duke treadmill score  13.  Echocardiogram 11/23/2018 EF 60-65, mild LVH, normal diastolic function, normal RV SF, mild to moderate TR, mild calcification of aortic valve   Event monitor 11/2018  Sinus rhythm - including NSR , sinus bradycardia and episodes of sinus tachycardia  No significant arrhythmias.  Past Medical History:  Diagnosis Date  . Chest pain    ETT 03/2019: Ex 13', no ECG changes  . Chicken pox   . Migraines   . Thyroid disease    Surgical Hx: The patient  has a past surgical history that includes Clavicle surgery (2006) and Cervix surgery (1992).   Current Medications: Current Meds  Medication Sig  . levothyroxine (SYNTHROID) 100 MCG tablet TAKE 1 TABLET (100 MCG TOTAL) BY MOUTH DAILY BEFORE BREAKFAST.  . Multiple Vitamins-Minerals (MULTIVITAMIN ADULT PO) Take 1 tablet by mouth daily.  Marland Kitchen triamcinolone cream (KENALOG) 0.1 % Apply 1 application topically 2 (two) times daily.  . valsartan (DIOVAN) 80 MG tablet Take 1 tablet (80 mg total) by mouth daily.  . [DISCONTINUED] hydrochlorothiazide (HYDRODIURIL) 25 MG tablet Take 1 tablet (25 mg total) by mouth daily.     Allergies:   Other   Social History   Tobacco Use  . Smoking status: Never Smoker  . Smokeless tobacco: Never Used  Substance Use Topics  . Alcohol use: Yes    Alcohol/week: 15.0 standard drinks    Types: 14 Glasses of wine, 1 Cans of beer per  week    Comment: Has wine daily, occasional beer and liquor  . Drug use: No     Family Hx: The patient's family history includes Cancer in her maternal grandfather; Depression in her mother; Diabetes in her mother; Early death in her paternal grandfather; Hearing loss in her father; Hyperlipidemia in her maternal grandfather; Miscarriages / [place] in her mother; Stroke in her father and paternal grandmother.  ROS:   Please see the history of present illness.    ROS All other systems reviewed and are negative.   EKGs/Labs/Other Test Reviewed:    EKG:  EKG is not  ordered today.  The ekg ordered today demonstrates n/a  Recent Labs: 03/11/2019: ALT 14; Hemoglobin 13.3; Platelets 271.0; TSH 0.81 05/03/2019: BUN 14; Creatinine, Ser 0.75; Potassium 3.9; Sodium 136   Recent Lipid Panel Lab Results  Component Value Date/Time   CHOL 191 03/11/2019 02:53 PM   TRIG 44.0 03/11/2019 02:53 PM   HDL 97.70 03/11/2019 02:53 PM   CHOLHDL 2 03/11/2019 02:53 PM   LDLCALC 85 03/11/2019 02:53 PM    Physical Exam:    VS:  BP 118/70   Pulse 63   Ht 5\' 3"  (1.6 m)   Wt 140 lb 3.2 oz (63.6 kg)   SpO2 99%   BMI 24.84 kg/m     Wt Readings from Last 3 Encounters:  07/03/19 140 lb 3.2 oz (63.6 kg)  03/26/19 138 lb (62.6 kg)  03/11/19 136 lb (61.7 kg)     Physical Exam  Constitutional: She is oriented to person, place, and time. She appears well-developed and well-nourished. No distress.  HENT:  Head: Normocephalic and atraumatic.  Neck: No JVD present.  Cardiovascular: Normal rate, regular rhythm, S1 normal, S2 normal and normal heart sounds.  No murmur heard. Pulmonary/Chest: Effort normal. She has no rales.  Abdominal: Soft. There is no hepatomegaly.  Musculoskeletal:        General: No edema.     Cervical back: Neck supple.  Neurological: She is alert and oriented to person, place, and time.  Skin: Skin is warm and dry.    ASSESSMENT & PLAN:    1. Chest pain, unspecified type Symptoms have overall resolved.  Recent coronary CTA was negative for CAD and her calcium score is 0.  Chest symptoms may have been related to a viral syndrome.  Although, Covid antibodies were negative.  No further cardiac testing is warranted.  2. Essential hypertension Blood pressure is currently controlled.  She does have some dizziness.  I suspect given her activity level, HCTZ is contributing to her dizziness.  Therefore, I recommend she stop taking HCTZ and potassium.  I have asked her to continue to monitor blood pressure and send readings after 2 weeks.  If her blood  pressure is above target, we can increase her valsartan.   Dispo:  Return in about 6 months (around 12/31/2019) for Routine Follow Up, w/ Dr. Acie Fredrickson, or Richardson Dopp, PA-C, (virtual or in-person).   Medication Adjustments/Labs and Tests Ordered: Current medicines are reviewed at length with the patient today.  Concerns regarding medicines are outlined above.  Tests Ordered: No orders of the defined types were placed in this encounter.  Medication Changes: No orders of the defined types were placed in this encounter.   Signed, Richardson Dopp, PA-C  07/03/2019 11:10 AM    [facility] [place], [place], [place]  [address] Phone: [phone]; Fax: [phone]

## 2019-07-03 ENCOUNTER — Other Ambulatory Visit: Payer: Self-pay

## 2019-07-03 ENCOUNTER — Encounter: Payer: Self-pay | Admitting: Physician Assistant

## 2019-07-03 ENCOUNTER — Ambulatory Visit (INDEPENDENT_AMBULATORY_CARE_PROVIDER_SITE_OTHER): Payer: Managed Care, Other (non HMO) | Admitting: Physician Assistant

## 2019-07-03 VITALS — BP 118/70 | HR 63 | Ht 63.0 in | Wt 140.2 lb

## 2019-07-03 DIAGNOSIS — R079 Chest pain, unspecified: Secondary | ICD-10-CM

## 2019-07-03 DIAGNOSIS — I10 Essential (primary) hypertension: Secondary | ICD-10-CM | POA: Diagnosis not present

## 2019-07-03 NOTE — Patient Instructions (Addendum)
Medication Instructions:  Your physician has recommended you make the following change in your medication:   1. Discontinue HCTZ 2. Discontinue Potassium supplement  Labwork: None ordered.  Testing/Procedures: None ordered.  Follow-Up: Your physician recommends that you schedule a follow-up appointment in:   6 months with Dr. Acie Fredrickson or Richardson Dopp, PA  Any Other Special Instructions Will Be Listed Below (If Applicable).  **Please keep an eye on your blood pressure. Call the office in 2 weeks with current measurements**   If you need a refill on your cardiac medications before your next appointment, please call your pharmacy.

## 2019-07-19 ENCOUNTER — Other Ambulatory Visit: Payer: Self-pay | Admitting: Cardiovascular Disease

## 2019-08-12 DIAGNOSIS — I10 Essential (primary) hypertension: Secondary | ICD-10-CM

## 2019-08-12 MED ORDER — VALSARTAN 160 MG PO TABS: 160.0000 mg | ORAL_TABLET | Freq: Every day | ORAL | 11 refills | Status: DC

## 2019-08-12 NOTE — Telephone Encounter (Signed)
See note to patient. PLAN:  DC HCTZ, potassium  Increase Valsartan to 160 mg once daily.  I sent Rx to her pharmacy. Schedule BMET in 2 weeks.  I placed order. Richardson Dopp, PA-C    08/12/2019 1:03 PM

## 2019-08-12 NOTE — Telephone Encounter (Signed)
I called and spoke with patient, she will come in on 3/8//21 for BMET.

## 2019-08-22 ENCOUNTER — Other Ambulatory Visit: Payer: Self-pay | Admitting: Cardiovascular Disease

## 2019-08-22 ENCOUNTER — Other Ambulatory Visit: Payer: Self-pay | Admitting: Physician Assistant

## 2019-08-26 ENCOUNTER — Other Ambulatory Visit: Payer: Managed Care, Other (non HMO) | Admitting: *Deleted

## 2019-08-26 ENCOUNTER — Other Ambulatory Visit: Payer: Self-pay

## 2019-08-26 DIAGNOSIS — I10 Essential (primary) hypertension: Secondary | ICD-10-CM

## 2019-08-27 LAB — BASIC METABOLIC PANEL
BUN/Creatinine Ratio: 14 (ref 9–23)
BUN: 10 mg/dL (ref 6–24)
CO2: 23 mmol/L (ref 20–29)
Calcium: 9.1 mg/dL (ref 8.7–10.2)
Chloride: 102 mmol/L (ref 96–106)
Creatinine, Ser: 0.69 mg/dL (ref 0.57–1.00)
GFR calc Af Amer: 117 mL/min/{1.73_m2} (ref >59)
GFR calc non Af Amer: 101 mL/min/{1.73_m2} (ref >59)
Glucose: 97 mg/dL (ref 65–99)
Potassium: 4 mmol/L (ref 3.5–5.2)
Sodium: 139 mmol/L (ref 134–144)

## 2019-09-02 DIAGNOSIS — I119 Hypertensive heart disease without heart failure: Secondary | ICD-10-CM

## 2019-09-02 NOTE — Telephone Encounter (Signed)
Resume HCTZ at 12.5 mg once daily (this is a lower dose than previous). May not need K+ with this dose, so remain off of it for now.  BMET 1 week. Richardson Dopp, PA-C    09/02/2019 5:06 PM

## 2019-09-03 MED ORDER — HYDROCHLOROTHIAZIDE 12.5 MG PO CAPS: 12.5000 mg | ORAL_CAPSULE | Freq: Every day | ORAL | 3 refills | Status: DC

## 2019-09-03 NOTE — Telephone Encounter (Signed)
I called and spoke with patient, she is aware to restart HCTZ at 12.5 mg, patient states that she does not need a new prescription, she will cut her 25 mg in half. Patient will come in on 09/10/19 for repeat BMET.

## 2019-09-10 ENCOUNTER — Other Ambulatory Visit: Payer: Self-pay

## 2019-09-10 ENCOUNTER — Other Ambulatory Visit: Payer: Managed Care, Other (non HMO)

## 2019-09-10 DIAGNOSIS — I119 Hypertensive heart disease without heart failure: Secondary | ICD-10-CM

## 2019-09-10 LAB — BASIC METABOLIC PANEL
BUN/Creatinine Ratio: 20 (ref 9–23)
BUN: 15 mg/dL (ref 6–24)
CO2: 21 mmol/L (ref 20–29)
Calcium: 9.6 mg/dL (ref 8.7–10.2)
Chloride: 96 mmol/L (ref 96–106)
Creatinine, Ser: 0.74 mg/dL (ref 0.57–1.00)
GFR calc Af Amer: 108 mL/min/{1.73_m2} (ref >59)
GFR calc non Af Amer: 94 mL/min/{1.73_m2} (ref >59)
Glucose: 101 mg/dL — ABNORMAL HIGH (ref 65–99)
Potassium: 4.2 mmol/L (ref 3.5–5.2)
Sodium: 134 mmol/L (ref 134–144)

## 2019-09-16 ENCOUNTER — Ambulatory Visit: Payer: Managed Care, Other (non HMO) | Attending: Internal Medicine

## 2019-09-16 DIAGNOSIS — Z23 Encounter for immunization: Secondary | ICD-10-CM

## 2019-09-16 NOTE — Progress Notes (Signed)
   Covid-19 Vaccination Clinic  Name:  Theresa Raymond    MRN: [id number] DOB: 1970-07-10  09/16/2019  [name] was observed post Covid-19 immunization for 15 minutes without incident. She was provided with Vaccine Information Sheet and instruction to access the V-Safe system.   [name] was instructed to call 911 with any severe reactions post vaccine: Marland Kitchen Difficulty breathing  . Swelling of face and throat  . A fast heartbeat  . A bad rash all over body  . Dizziness and weakness   Immunizations Administered    Name Date Dose VIS Date Route   Pfizer COVID-19 Vaccine 09/16/2019  1:47 PM 0.3 mL 05/31/2019 Intramuscular   Manufacturer: [place]   Lot: [id number]   [place]: [id number]

## 2019-09-19 ENCOUNTER — Other Ambulatory Visit: Payer: Self-pay | Admitting: Cardiovascular Disease

## 2019-09-19 MED ORDER — HYDROCHLOROTHIAZIDE 12.5 MG PO CAPS: 12.5000 mg | ORAL_CAPSULE | Freq: Every day | ORAL | 2 refills | Status: DC

## 2019-10-09 ENCOUNTER — Ambulatory Visit: Payer: Managed Care, Other (non HMO) | Attending: Internal Medicine

## 2019-10-09 DIAGNOSIS — Z23 Encounter for immunization: Secondary | ICD-10-CM

## 2019-10-09 NOTE — Progress Notes (Signed)
   Covid-19 Vaccination Clinic  Name:  Rakeshia Sewell    MRN: [id number] DOB: February 27, 1971  10/09/2019  Ms. Becherer was observed post Covid-19 immunization for 15 minutes without incident. She was provided with Vaccine Information Sheet and instruction to access the V-Safe system.   Ms. Shelby was instructed to call 911 with any severe reactions post vaccine: Marland Kitchen Difficulty breathing  . Swelling of face and throat  . A fast heartbeat  . A bad rash all over body  . Dizziness and weakness   Immunizations Administered    Name Date Dose VIS Date Route   Pfizer COVID-19 Vaccine 10/09/2019  1:43 PM 0.3 mL 08/14/2018 Intramuscular   Manufacturer: [employer], [employer]   Lot: [id number]   [place]: [id number]

## 2019-12-21 ENCOUNTER — Telehealth: Payer: Managed Care, Other (non HMO) | Admitting: Emergency Medicine

## 2019-12-21 DIAGNOSIS — R300 Dysuria: Secondary | ICD-10-CM | POA: Diagnosis not present

## 2019-12-21 MED ORDER — CEPHALEXIN 500 MG PO CAPS: 500.0000 mg | ORAL_CAPSULE | Freq: Two times a day (BID) | ORAL | 0 refills | Status: AC

## 2019-12-21 NOTE — Progress Notes (Signed)
We are sorry that you are not feeling well.  Here is how we plan to help!  Based on what you shared with me it looks like you most likely have a simple urinary tract infection.  A UTI (Urinary Tract Infection) is a bacterial infection of the bladder.  Most cases of urinary tract infections are simple to treat but a key part of your care is to encourage you to drink plenty of fluids and watch your symptoms carefully.  I have prescribed Keflex 500 mg twice a day for 7 days.  Your symptoms should gradually improve. Call us if the burning in your urine worsens, you develop worsening fever, back pain or pelvic pain or if your symptoms do not resolve after completing the antibiotic.  Urinary tract infections can be prevented by drinking plenty of water to keep your body hydrated.  Also be sure when you wipe, wipe from front to back and don't hold it in!  If possible, empty your bladder every 4 hours.  Your e-visit answers were reviewed by a board certified advanced clinical practitioner to complete your personal care plan.  Depending on the condition, your plan could have included both over the counter or prescription medications.  If there is a problem please reply  once you have received a response from your provider.  Your safety is important to us.  If you have drug allergies check your prescription carefully.    You can use MyChart to ask questions about today's visit, request a non-urgent call back, or ask for a work or school excuse for 24 hours related to this e-Visit. If it has been greater than 24 hours you will need to follow up with your provider, or enter a new e-Visit to address those concerns.   You will get an e-mail in the next two days asking about your experience.  I hope that your e-visit has been valuable and will speed your recovery. Thank you for using e-visits.   **Please do not respond to this message unless you have follow up questions.** Greater than 5 but less than 10  minutes spent researching, coordinating, and implementing care for this patient today  

## 2019-12-30 ENCOUNTER — Other Ambulatory Visit: Payer: Self-pay

## 2019-12-30 ENCOUNTER — Telehealth (INDEPENDENT_AMBULATORY_CARE_PROVIDER_SITE_OTHER): Payer: Managed Care, Other (non HMO) | Admitting: Physician Assistant

## 2019-12-30 ENCOUNTER — Encounter: Payer: Self-pay | Admitting: Physician Assistant

## 2019-12-30 DIAGNOSIS — M546 Pain in thoracic spine: Secondary | ICD-10-CM

## 2019-12-30 MED ORDER — MELOXICAM 15 MG PO TABS: 15.0000 mg | ORAL_TABLET | Freq: Every day | ORAL | 0 refills | Status: DC

## 2019-12-30 MED ORDER — CYCLOBENZAPRINE HCL 10 MG PO TABS: 10.0000 mg | ORAL_TABLET | Freq: Three times a day (TID) | ORAL | 0 refills | Status: DC | PRN

## 2019-12-30 NOTE — Patient Instructions (Signed)
Instructions sent to MyChart

## 2019-12-30 NOTE — Progress Notes (Signed)
Virtual Visit via Video   I connected with patient on 12/30/19 at  2:30 PM EDT by a video enabled telemedicine application and verified that I am speaking with the correct person using two identifiers.  Location patient: Home Location provider: [facility], Office Persons participating in the virtual visit: Patient, Provider, [place] (Patina Moore)  I discussed the limitations of evaluation and management by telemedicine and the availability of in person appointments. The patient expressed understanding and agreed to proceed.  Subjective:   HPI:   Patient presents via [place] today complaining of 1 day of left-sided mid back pain first noted on waking yesterday morning.  Notes she did a lot of yard work Saturday evening.  Denies known trauma or injury.  Patient endorses pain is noted as a tension with spasming.  Does not radiate.  Is not associated with numbness, tingling.  Notes when she pulls the muscle it tends to be in this area.  Note she has done some gentle stretching, heat and ice with minimal relief.  Did take some ibuprofen this morning.  Currently her pain is 5 out of 10.  Worse with range of motion..  ROS:   See pertinent positives and negatives per HPI.  Patient Active Problem List   Diagnosis Date Noted  . Eczema 03/11/2019  . Chronic chest pain 09/18/2018  . HTN (hypertension) 09/18/2018  . Visit for preventive health examination 03/01/2018  . Hypothyroidism 03/01/2018    Social History   Tobacco Use  . Smoking status: Never Smoker  . Smokeless tobacco: Never Used  Substance Use Topics  . Alcohol use: Yes    Alcohol/week: 15.0 standard drinks    Types: 14 Glasses of wine, 1 Cans of beer per week    Comment: Has wine daily, occasional beer and liquor    Current Outpatient Medications:  .  hydrochlorothiazide (MICROZIDE) 12.5 MG capsule, Take 1 capsule (12.5 mg total) by mouth daily., Disp: 90 capsule, Rfl: 2 .  levothyroxine (SYNTHROID) 100 MCG  tablet, TAKE 1 TABLET (100 MCG TOTAL) BY MOUTH DAILY BEFORE BREAKFAST., Disp: 90 tablet, Rfl: 1 .  Multiple Vitamins-Minerals (MULTIVITAMIN ADULT PO), Take 1 tablet by mouth daily., Disp: , Rfl:  .  triamcinolone cream (KENALOG) 0.1 %, Apply 1 application topically 2 (two) times daily., Disp: 30 g, Rfl: 0 .  valsartan (DIOVAN) 160 MG tablet, Take 1 tablet (160 mg total) by mouth daily., Disp: 30 tablet, Rfl: 11  Allergies  Allergen Reactions  . Other Swelling and Other (See Comments)    TREE NUTS make patient's throat "tight," but her breathing doesn't become impaired (I asked)    Objective:   There were no vitals taken for this visit.  Patient is well-developed, well-nourished in no acute distress.  Resting in her desk chair at home.  Head is normocephalic, atraumatic.  No labored breathing.  Speech is clear and coherent with logical content.  Patient is alert and oriented at baseline.   Assessment and Plan:   1. Acute left-sided thoracic back pain In the absence of trauma.  No alarm signs or symptoms present.  Recommend rest and heat.  Gentle stretching discussed.  Rx meloxicam 50 mg once daily with food.  Tylenol for breakthrough pain.  Rx Flexeril to be used up to 3 times daily.  Strict return precautions reviewed with patient.  Patient voiced understanding and agreement with the plan.  Written instructions were sent to patient's MyChart.  - meloxicam (MOBIC) 15 MG tablet; Take 1 tablet (15  mg total) by mouth daily.  Dispense: 15 tablet; Refill: 0 - cyclobenzaprine (FLEXERIL) 10 MG tablet; Take 1 tablet (10 mg total) by mouth 3 (three) times daily as needed for muscle spasms.  Dispense: 30 tablet; Refill: 0    Leeanne Rio, [place] 12/30/2019

## 2019-12-30 NOTE — Progress Notes (Signed)
I have discussed the procedure for the virtual visit with the patient who has given consent to proceed with assessment and treatment.   Vander Kueker S Dexton Zwilling, CMA     

## 2020-01-06 ENCOUNTER — Ambulatory Visit: Payer: Managed Care, Other (non HMO) | Admitting: Physician Assistant

## 2020-02-10 NOTE — Progress Notes (Signed)
Cardiology Office Note:    Date:  02/11/2020   ID:  Theresa Raymond, DOB Sep 07, 1970, MRN [id number]  PCP:  Brunetta Jeans, PA-C  Cardiologist:  Mertie Moores, MD   Electrophysiologist:  None   Referring MD: Brunetta Jeans, PA-C   Chief Complaint:  Follow-up (Blood Pressure)    Patient Profile:    Theresa Raymond is a 49 y.o. female with:   Hypertension  Chest pain  CT 05/2019: Ca score 0; normal coronary arteries  ETT 03/2019: Low risk  Echocardiogram 11/2018: EF 60-65, mild to mod TR, mild LVH  Palpitations  Event monitor 11/2018: NSR  Prior CV studies:   Coronary CTA 06/10/2019 IMPRESSION: 1. Poor quality study with significant motion and misregistration artifact 2. Calcium score 0 2. Normal right dominant coronary arteries 4. Normal aortic root 3.2 cm  GXT 04/18/2019 ETT with good exercise tolerance (13:01); no chest pain; normal blood pressure response (systolic blood pressure documented at 131 in stage IV felt to be inaccurate); no ST changes; negative adequate exercise treadmill; Duke treadmill score 13.  Echocardiogram 11/23/2018 EF 60-65, mild LVH, normal diastolic function, normal RV SF, mild to moderate TR, mild calcification of aortic valve  Event monitor 11/2018  Sinus rhythm - including NSR , sinus bradycardia and episodes of sinus tachycardia  No significant arrhythmias.   History of Present Illness:    Theresa Raymond was last seen in 07/2019.  I tried to stop her HCTZ and increase her Valsartan due to some dizziness she was having.  Her BP was uncontrolled with this and she had to resume the HCTZ.  She returns for follow up.   She is here alone.  She has been doing well.  She is currently training for a marathon in the [place] next month.  She has occasional chest pains that are not related to activity.  Her BP at home is usually 120s-130s/.     Past Medical History:  Diagnosis Date  . Chest pain    ETT 03/2019: Ex 13', no  ECG changes  . Chicken pox   . Migraines   . Thyroid disease     Current Medications: Current Meds  Medication Sig  . cyclobenzaprine (FLEXERIL) 10 MG tablet Take 1 tablet (10 mg total) by mouth 3 (three) times daily as needed for muscle spasms.  . hydrochlorothiazide (MICROZIDE) 12.5 MG capsule Take 1 capsule (12.5 mg total) by mouth daily.  Marland Kitchen levothyroxine (SYNTHROID) 100 MCG tablet TAKE 1 TABLET (100 MCG TOTAL) BY MOUTH DAILY BEFORE BREAKFAST.  . meloxicam (MOBIC) 15 MG tablet Take 15 mg by mouth as needed for pain.  . Multiple Vitamins-Minerals (MULTIVITAMIN ADULT PO) Take 1 tablet by mouth daily.  Marland Kitchen triamcinolone cream (KENALOG) 0.1 % Apply 1 application topically as needed (for skin irritation).  . valsartan (DIOVAN) 160 MG tablet Take 1 tablet (160 mg total) by mouth daily.  . [DISCONTINUED] triamcinolone cream (KENALOG) 0.1 % Apply 1 application topically 2 (two) times daily.     Allergies:   Other   Social History   Tobacco Use  . Smoking status: Never Smoker  . Smokeless tobacco: Never Used  Vaping Use  . Vaping Use: Never used  Substance Use Topics  . Alcohol use: Yes    Alcohol/week: 15.0 standard drinks    Types: 14 Glasses of wine, 1 Cans of beer per week    Comment: Has wine daily, occasional beer and liquor  . Drug use: No     Family  Hx: The patient's family history includes Cancer in her maternal grandfather; Depression in her mother; Diabetes in her mother; Early death in her paternal grandfather; Hearing loss in her father; Hyperlipidemia in her maternal grandfather; Miscarriages / [place] in her mother; Stroke in her father and paternal grandmother.  ROS   EKGs/Labs/Other Test Reviewed:    EKG:  EKG is   ordered today.  The ekg ordered today demonstrates normal sinus rhythm, HR 63, LAD, no ST-TW changes, QTc 417, similar to prior tracings  Recent Labs: 03/11/2019: ALT 14; Hemoglobin 13.3; Platelets 271.0; TSH 0.81 09/10/2019: BUN 15; Creatinine,  Ser 0.74; Potassium 4.2; Sodium 134   Recent Lipid Panel Lab Results  Component Value Date/Time   CHOL 191 03/11/2019 02:53 PM   TRIG 44.0 03/11/2019 02:53 PM   HDL 97.70 03/11/2019 02:53 PM   CHOLHDL 2 03/11/2019 02:53 PM   LDLCALC 85 03/11/2019 02:53 PM    Physical Exam:    VS:  BP 130/70   Pulse 63   Ht 5\' 3"  (1.6 m)   Wt 138 lb (62.6 kg)   SpO2 98%   BMI 24.45 kg/m     Wt Readings from Last 3 Encounters:  02/11/20 138 lb (62.6 kg)  07/03/19 140 lb 3.2 oz (63.6 kg)  03/26/19 138 lb (62.6 kg)     Constitutional:      Appearance: Healthy appearance. Not in distress.  Neck:     Vascular: JVD normal.  Pulmonary:     Effort: Pulmonary effort is normal.     Breath sounds: No wheezing. No rales.  Cardiovascular:     Normal rate. Regular rhythm. Normal S1. Normal S2.     Murmurs: There is no murmur.  Edema:    Peripheral edema absent.  Abdominal:     Palpations: Abdomen is soft.  Skin:    General: Skin is warm and dry.  Neurological:     Mental Status: Alert and oriented to person, place and time.     Cranial Nerves: Cranial nerves are intact.       ASSESSMENT & PLAN:    1. Essential hypertension The patient's blood pressure is controlled on her current regimen.  Continue current dose of Valsartan and HCTZ.  She can follow up with primary care for her BP and see Cardiology as needed.     Dispo:  Return for follow up as needed.   Medication Adjustments/Labs and Tests Ordered: Current medicines are reviewed at length with the patient today.  Concerns regarding medicines are outlined above.  Tests Ordered: Orders Placed This Encounter  Procedures  . EKG 12-Lead   Medication Changes: No orders of the defined types were placed in this encounter.   Signed, Richardson Dopp, PA-C  02/11/2020 11:26 AM    [facility] [place], [place], [place]  [address] Phone: [phone]; Fax: [phone]

## 2020-02-11 ENCOUNTER — Encounter: Payer: Self-pay | Admitting: Physician Assistant

## 2020-02-11 ENCOUNTER — Ambulatory Visit: Payer: Managed Care, Other (non HMO) | Admitting: Physician Assistant

## 2020-02-11 ENCOUNTER — Other Ambulatory Visit: Payer: Self-pay

## 2020-02-11 VITALS — BP 130/70 | HR 63 | Ht 63.0 in | Wt 138.0 lb

## 2020-02-11 DIAGNOSIS — I10 Essential (primary) hypertension: Secondary | ICD-10-CM

## 2020-02-11 NOTE — Patient Instructions (Signed)
Medication Instructions:  Your physician recommends that you continue on your current medications as directed. Please refer to the Current Medication list given to you today.  *If you need a refill on your cardiac medications before your next appointment, please call your pharmacy*  Lab Work: None ordered today  Testing/Procedures: None ordered today  Follow-Up: At [facility], you and your health needs are our priority.  As part of our continuing mission to provide you with exceptional heart care, we have created designated Provider Care Teams.  These Care Teams include your primary Cardiologist (physician) and Advanced Practice Providers (APPs -  Physician Assistants and Nurse Practitioners) who all work together to provide you with the care you need, when you need it.  Your next appointment:   As needed  The format for your next appointment:   In Person  Provider:   Mertie Moores, MD

## 2020-02-27 ENCOUNTER — Other Ambulatory Visit: Payer: Self-pay | Admitting: Physician Assistant

## 2020-04-15 ENCOUNTER — Other Ambulatory Visit: Payer: Self-pay

## 2020-04-15 ENCOUNTER — Ambulatory Visit (INDEPENDENT_AMBULATORY_CARE_PROVIDER_SITE_OTHER): Payer: Managed Care, Other (non HMO) | Admitting: Physician Assistant

## 2020-04-15 ENCOUNTER — Encounter: Payer: Self-pay | Admitting: Physician Assistant

## 2020-04-15 VITALS — BP 130/70 | HR 80 | Temp 98.3°F | Resp 16 | Ht 64.0 in | Wt 140.0 lb

## 2020-04-15 DIAGNOSIS — E039 Hypothyroidism, unspecified: Secondary | ICD-10-CM | POA: Diagnosis not present

## 2020-04-15 DIAGNOSIS — I10 Essential (primary) hypertension: Secondary | ICD-10-CM | POA: Diagnosis not present

## 2020-04-15 DIAGNOSIS — M92521 Juvenile osteochondrosis of tibia tubercle, right leg: Secondary | ICD-10-CM | POA: Diagnosis not present

## 2020-04-15 DIAGNOSIS — Z0000 Encounter for general adult medical examination without abnormal findings: Secondary | ICD-10-CM | POA: Diagnosis not present

## 2020-04-15 DIAGNOSIS — Z1211 Encounter for screening for malignant neoplasm of colon: Secondary | ICD-10-CM | POA: Diagnosis not present

## 2020-04-15 DIAGNOSIS — Z1159 Encounter for screening for other viral diseases: Secondary | ICD-10-CM | POA: Diagnosis not present

## 2020-04-15 DIAGNOSIS — Z23 Encounter for immunization: Secondary | ICD-10-CM

## 2020-04-15 DIAGNOSIS — Z1231 Encounter for screening mammogram for malignant neoplasm of breast: Secondary | ICD-10-CM | POA: Diagnosis not present

## 2020-04-15 DIAGNOSIS — M25561 Pain in right knee: Secondary | ICD-10-CM

## 2020-04-15 LAB — LIPID PANEL
Cholesterol: 210 mg/dL — ABNORMAL HIGH (ref 0–200)
HDL: 110.9 mg/dL (ref >39.00)
LDL Cholesterol: 89 mg/dL (ref 0–99)
NonHDL: 99.07
Total CHOL/HDL Ratio: 2
Triglycerides: 50 mg/dL (ref 0.0–149.0)
VLDL: 10 mg/dL (ref 0.0–40.0)

## 2020-04-15 LAB — COMPREHENSIVE METABOLIC PANEL
ALT: 10 U/L (ref 0–35)
AST: 20 U/L (ref 0–37)
Albumin: 4.6 g/dL (ref 3.5–5.2)
Alkaline Phosphatase: 31 U/L — ABNORMAL LOW (ref 39–117)
BUN: 14 mg/dL (ref 6–23)
CO2: 32 mEq/L (ref 19–32)
Calcium: 9.3 mg/dL (ref 8.4–10.5)
Chloride: 100 mEq/L (ref 96–112)
Creatinine, Ser: 0.75 mg/dL (ref 0.40–1.20)
GFR: 92 mL/min (ref >60.00)
Glucose, Bld: 102 mg/dL — ABNORMAL HIGH (ref 70–99)
Potassium: 3.8 mEq/L (ref 3.5–5.1)
Sodium: 138 mEq/L (ref 135–145)
Total Bilirubin: 0.6 mg/dL (ref 0.2–1.2)
Total Protein: 6.7 g/dL (ref 6.0–8.3)

## 2020-04-15 LAB — CBC WITH DIFFERENTIAL/PLATELET
Basophils Absolute: 0.1 10*3/uL (ref 0.0–0.1)
Basophils Relative: 1.4 % (ref 0.0–3.0)
Eosinophils Absolute: 0.1 10*3/uL (ref 0.0–0.7)
Eosinophils Relative: 1.2 % (ref 0.0–5.0)
HCT: 39.8 % (ref 36.0–46.0)
Hemoglobin: 13.7 g/dL (ref 12.0–15.0)
Lymphocytes Relative: 28.6 % (ref 12.0–46.0)
Lymphs Abs: 1.3 10*3/uL (ref 0.7–4.0)
MCHC: 34.5 g/dL (ref 30.0–36.0)
MCV: 95.8 fl (ref 78.0–100.0)
Monocytes Absolute: 0.4 10*3/uL (ref 0.1–1.0)
Monocytes Relative: 7.9 % (ref 3.0–12.0)
Neutro Abs: 2.8 10*3/uL (ref 1.4–7.7)
Neutrophils Relative %: 60.9 % (ref 43.0–77.0)
Platelets: 282 10*3/uL (ref 150.0–400.0)
RBC: 4.16 Mil/uL (ref 3.87–5.11)
RDW: 13.3 % (ref 11.5–15.5)
WBC: 4.6 10*3/uL (ref 4.0–10.5)

## 2020-04-15 LAB — TSH: TSH: 1.09 u[IU]/mL (ref 0.35–4.50)

## 2020-04-15 LAB — HEMOGLOBIN A1C: Hgb A1c MFr Bld: 5.5 % (ref 4.6–6.5)

## 2020-04-15 MED ORDER — TRIAMCINOLONE ACETONIDE 0.1 % EX CREA: 1.0000 "application " | TOPICAL_CREAM | CUTANEOUS | 1 refills | Status: DC | PRN

## 2020-04-15 NOTE — Progress Notes (Signed)
Patient presents to clinic today for annual exam.  Patient is fasting for labs.  Acute Concerns: Patient endorses chronic bump under R knee since 49-years old. Has never really caused issue for her. She is an avid runner, just getting back into running marathons. Has noted tenderness and occasional swelling of this area. Denies trauma or injury. Denies bruising or other skin change.   Chronic Issues: Hypertension -- Currently on Diovan and HCTZ, taking as directed. Patient denies chest pain, palpitations, lightheadedness, dizziness, vision changes or frequent headaches.  BP Readings from Last 3 Encounters:  04/15/20 130/70  02/11/20 130/70  07/03/19 118/70   Hypothyroidism -- Currently on a regimen of levothyroxine 100 mcg daily. She endorses taking as directed and tolerating well. Due for TSH.   Health Maintenance: Immunizations -- Would like flu shot and Shingrix today. Colon Cancer Screening -- Due. Average risk.  Mammogram -- Due for annual. Denies concerns.  PAP -- up-to-date   Past Medical History:  Diagnosis Date  . Chest pain    ETT 03/2019: Ex 13', no ECG changes  . Chicken pox   . Migraines   . Thyroid disease     Past Surgical History:  Procedure Laterality Date  . CERVIX SURGERY  1992  . CLAVICLE SURGERY  2006    Current Outpatient Medications on File Prior to Visit  Medication Sig Dispense Refill  . hydrochlorothiazide (MICROZIDE) 12.5 MG capsule Take 1 capsule (12.5 mg total) by mouth daily. 90 capsule 2  . levothyroxine (SYNTHROID) 100 MCG tablet TAKE 1 TABLET (100 MCG TOTAL) BY MOUTH DAILY BEFORE BREAKFAST. 90 tablet 0  . Multiple Vitamins-Minerals (MULTIVITAMIN ADULT PO) Take 1 tablet by mouth daily.    . valsartan (DIOVAN) 160 MG tablet Take 1 tablet (160 mg total) by mouth daily. 30 tablet 11   No current facility-administered medications on file prior to visit.    Allergies  Allergen Reactions  . Other Swelling and Other (See Comments)     TREE NUTS make patient's throat "tight," but her breathing doesn't become impaired (I asked)    Family History  Problem Relation Age of Onset  . Depression Mother   . Diabetes Mother   . Miscarriages / [place] Mother   . Hearing loss Father   . Stroke Father   . Cancer Maternal Grandfather   . Hyperlipidemia Maternal Grandfather   . Stroke Paternal Grandmother   . Early death Paternal Grandfather        Polio    Social History   Socioeconomic History  . Marital status: Married    Spouse name: Not on file  . Number of children: Not on file  . Years of education: Not on file  . Highest education level: Not on file  Occupational History  . Not on file  Tobacco Use  . Smoking status: Never Smoker  . Smokeless tobacco: Never Used  Vaping Use  . Vaping Use: Never used  Substance and Sexual Activity  . Alcohol use: Yes    Alcohol/week: 15.0 standard drinks    Types: 14 Glasses of wine, 1 Cans of beer per week    Comment: Has wine daily, occasional beer and liquor  . Drug use: No  . Sexual activity: Yes  Other Topics Concern  . Not on file  Social History Narrative  . Not on file   Social Determinants of Health   Financial Resource Strain:   . Difficulty of Paying Living Expenses: Not on file  Food Insecurity:   .  Worried About Charity fundraiser in the Last Year: Not on file  . Ran Out of Food in the Last Year: Not on file  Transportation Needs:   . Lack of Transportation (Medical): Not on file  . Lack of Transportation (Non-Medical): Not on file  Physical Activity:   . Days of Exercise per Week: Not on file  . Minutes of Exercise per Session: Not on file  Stress:   . Feeling of Stress : Not on file  Social Connections:   . Frequency of Communication with Friends and Family: Not on file  . Frequency of Social Gatherings with Friends and Family: Not on file  . Attends Religious Services: Not on file  . Active Member of Clubs or Organizations: Not on file   . Attends Archivist Meetings: Not on file  . Marital Status: Not on file  Intimate Partner Violence:   . Fear of Current or Ex-Partner: Not on file  . Emotionally Abused: Not on file  . Physically Abused: Not on file  . Sexually Abused: Not on file   Review of Systems  Constitutional: Negative for fever and weight loss.  HENT: Negative for ear discharge, ear pain, hearing loss and tinnitus.   Eyes: Negative for blurred vision, double vision, photophobia and pain.  Respiratory: Negative for cough and shortness of breath.   Cardiovascular: Negative for chest pain and palpitations.  Gastrointestinal: Negative for abdominal pain, blood in stool, constipation, diarrhea, heartburn, melena, nausea and vomiting.  Genitourinary: Negative for dysuria, flank pain, frequency, hematuria and urgency.  Musculoskeletal: Negative for falls.       Anterior knee/leg pain  Skin: Positive for rash.  Neurological: Negative for dizziness, loss of consciousness and headaches.  Endo/Heme/Allergies: Negative for environmental allergies.  Psychiatric/Behavioral: Negative for depression, hallucinations, substance abuse and suicidal ideas. The patient is not nervous/anxious and does not have insomnia.    BP 130/70   Pulse 80   Temp 98.3 F (36.8 C) (Temporal)   Resp 16   Ht 5\' 4"  (1.626 m)   Wt 140 lb (63.5 kg)   SpO2 99%   BMI 24.03 kg/m   Physical Exam Vitals reviewed.  Constitutional:      Appearance: Normal appearance.  HENT:     Head: Normocephalic and atraumatic.     Right Ear: Tympanic membrane, ear canal and external ear normal.     Left Ear: Tympanic membrane, ear canal and external ear normal.     Nose: Nose normal. No mucosal edema.     Mouth/Throat:     Pharynx: Uvula midline. No oropharyngeal exudate or posterior oropharyngeal erythema.  Eyes:     Conjunctiva/sclera: Conjunctivae normal.     Pupils: Pupils are equal, round, and reactive to light.  Neck:     Thyroid: No  thyromegaly.  Cardiovascular:     Rate and Rhythm: Normal rate and regular rhythm.     Pulses: Normal pulses.     Heart sounds: Normal heart sounds.  Pulmonary:     Effort: Pulmonary effort is normal. No respiratory distress.     Breath sounds: Normal breath sounds. No wheezing or rales.  Abdominal:     General: Bowel sounds are normal. There is no distension.     Palpations: Abdomen is soft. There is no mass.     Tenderness: There is no abdominal tenderness. There is no guarding or rebound.  Musculoskeletal:     Cervical back: Neck supple.       Legs:  Lymphadenopathy:     Cervical: No cervical adenopathy.  Skin:    General: Skin is warm and dry.     Findings: No rash.  Neurological:     Mental Status: She is alert and oriented to person, place, and time.     Cranial Nerves: No cranial nerve deficit.     Assessment/Plan: 1. Visit for preventive health examination Depression screen negative. Health Maintenance reviewed. Preventive schedule discussed and handout given in AVS. Will obtain fasting labs today.  - CBC with Differential/Platelet - Comprehensive metabolic panel - Lipid panel - Hemoglobin A1c  2. Encounter for screening mammogram for malignant neoplasm of breast Order for annual mammogram placed.  - MM DIGITAL SCREENING BILATERAL; Future  3. Colon cancer screening Referral to GI placed for colonoscopy. Patient is average risk and asymptomatic.  - Ambulatory referral to Gastroenterology  4. Need for hepatitis C screening test Due per CDC/USPSTF guidelines. Agrees to screening today. Order placed.  - Hepatitis C Antibody  5. Primary hypertension BP stable. Asymptomatic. Continue current regimen. Repeat labs today.  - Comprehensive metabolic panel - Lipid panel - Hemoglobin A1c  6. Hypothyroidism, unspecified type Taking levothyroxine as directed. Due for repeat TSH today. Order placed.  - TSH  7. Right anterior knee pain 8. Osgood-Schlatter's  disease of right lower extremity Referral to Sports Medicine placed for further evaluation and management.  - Ambulatory referral to Sports Medicine   This visit occurred during the SARS-CoV-2 public health emergency.  Safety protocols were in place, including screening questions prior to the visit, additional usage of staff PPE, and extensive cleaning of exam room while observing appropriate contact time as indicated for disinfecting solutions.    Leeanne Rio, PA-C

## 2020-04-15 NOTE — Patient Instructions (Addendum)
Please go to the lab for blood work.   Our office will call you with your results unless you have chosen to receive results via MyChart.  If your blood work is normal we will follow-up each year for physicals and as scheduled for chronic medical problems.  If anything is abnormal we will treat accordingly and get you in for a follow-up.  I have sent in a refill of the triamcinolone cream for you to apply to the finger twice daily over the next week. Keep hands clean and dry. Keep them moisturized as well. Let me know if things are not continuing to resolve.   I am setting you up with Sports medicine for the anterior leg pain and evidence of osgood schlatters starting in youth.  If you do not hear from them within 10 days, please let me know.    Preventive Care 60-54 Years Old, Female Preventive care refers to visits with your health care provider and lifestyle choices that can promote health and wellness. This includes:  A yearly physical exam. This may also be called an annual well check.  Regular dental visits and eye exams.  Immunizations.  Screening for certain conditions.  Healthy lifestyle choices, such as eating a healthy diet, getting regular exercise, not using drugs or products that contain nicotine and tobacco, and limiting alcohol use. What can I expect for my preventive care visit? Physical exam Your health care provider will check your:  Height and weight. This may be used to calculate body mass index (BMI), which tells if you are at a healthy weight.  Heart rate and blood pressure.  Skin for abnormal spots. Counseling Your health care provider may ask you questions about your:  Alcohol, tobacco, and drug use.  Emotional well-being.  Home and relationship well-being.  Sexual activity.  Eating habits.  Work and work Statistician.  Method of birth control.  Menstrual cycle.  Pregnancy history. What immunizations do I need?  Influenza (flu)  vaccine  This is recommended every year. Tetanus, diphtheria, and pertussis (Tdap) vaccine  You may need a Td booster every 10 years. Varicella (chickenpox) vaccine  You may need this if you have not been vaccinated. Zoster (shingles) vaccine  You may need this after age 47. Measles, mumps, and rubella (MMR) vaccine  You may need at least one dose of MMR if you were born in 1957 or later. You may also need a second dose. Pneumococcal conjugate (PCV13) vaccine  You may need this if you have certain conditions and were not previously vaccinated. Pneumococcal polysaccharide (PPSV23) vaccine  You may need one or two doses if you smoke cigarettes or if you have certain conditions. Meningococcal conjugate (MenACWY) vaccine  You may need this if you have certain conditions. Hepatitis A vaccine  You may need this if you have certain conditions or if you travel or work in places where you may be exposed to hepatitis A. Hepatitis B vaccine  You may need this if you have certain conditions or if you travel or work in places where you may be exposed to hepatitis B. Haemophilus influenzae type b (Hib) vaccine  You may need this if you have certain conditions. Human papillomavirus (HPV) vaccine  If recommended by your health care provider, you may need three doses over 6 months. You may receive vaccines as individual doses or as more than one vaccine together in one shot (combination vaccines). Talk with your health care provider about the risks and benefits of combination vaccines.  What tests do I need? Blood tests  Lipid and cholesterol levels. These may be checked every 5 years, or more frequently if you are over 4 years old.  Hepatitis C test.  Hepatitis B test. Screening  Lung cancer screening. You may have this screening every year starting at age 42 if you have a 30-pack-year history of smoking and currently smoke or have quit within the past 15 years.  Colorectal cancer  screening. All adults should have this screening starting at age 26 and continuing until age 40. Your health care provider may recommend screening at age 107 if you are at increased risk. You will have tests every 1-10 years, depending on your results and the type of screening test.  Diabetes screening. This is done by checking your blood sugar (glucose) after you have not eaten for a while (fasting). You may have this done every 1-3 years.  Mammogram. This may be done every 1-2 years. Talk with your health care provider about when you should start having regular mammograms. This may depend on whether you have a family history of breast cancer.  BRCA-related cancer screening. This may be done if you have a family history of breast, ovarian, tubal, or peritoneal cancers.  Pelvic exam and Pap test. This may be done every 3 years starting at age 68. Starting at age 94, this may be done every 5 years if you have a Pap test in combination with an HPV test. Other tests  Sexually transmitted disease (STD) testing.  Bone density scan. This is done to screen for osteoporosis. You may have this scan if you are at high risk for osteoporosis. Follow these instructions at home: Eating and drinking  Eat a diet that includes fresh fruits and vegetables, whole grains, lean protein, and low-fat dairy.  Take vitamin and mineral supplements as recommended by your health care provider.  Do not drink alcohol if: ? Your health care provider tells you not to drink. ? You are pregnant, may be pregnant, or are planning to become pregnant.  If you drink alcohol: ? Limit how much you have to 0-1 drink a day. ? Be aware of how much alcohol is in your drink. In the [place] one drink equals one 12 oz bottle of beer (355 mL), one 5 oz glass of wine (148 mL), or one 1 oz glass of hard liquor (44 mL). Lifestyle  Take daily care of your teeth and gums.  Stay active. Exercise for at least 30 minutes on 5 or more days  each week.  Do not use any products that contain nicotine or tobacco, such as cigarettes, e-cigarettes, and chewing tobacco. If you need help quitting, ask your health care provider.  If you are sexually active, practice safe sex. Use a condom or other form of birth control (contraception) in order to prevent pregnancy and STIs (sexually transmitted infections).  If told by your health care provider, take low-dose aspirin daily starting at age 75. What's next?  Visit your health care provider once a year for a well check visit.  Ask your health care provider how often you should have your eyes and teeth checked.  Stay up to date on all vaccines. This information is not intended to replace advice given to you by your health care provider. Make sure you discuss any questions you have with your health care provider. Document Revised: 02/15/2018 Document Reviewed: 02/15/2018 Elsevier Patient Education  2020 [employer].

## 2020-04-16 LAB — HEPATITIS C ANTIBODY
Hepatitis C Ab: NONREACTIVE
SIGNAL TO CUT-OFF: 0.01 (ref <1.00)

## 2020-04-17 ENCOUNTER — Encounter: Payer: Self-pay | Admitting: Family Medicine

## 2020-04-17 ENCOUNTER — Ambulatory Visit: Payer: Managed Care, Other (non HMO) | Admitting: Family Medicine

## 2020-04-17 ENCOUNTER — Other Ambulatory Visit: Payer: Self-pay

## 2020-04-17 ENCOUNTER — Ambulatory Visit: Payer: Self-pay

## 2020-04-17 ENCOUNTER — Ambulatory Visit (INDEPENDENT_AMBULATORY_CARE_PROVIDER_SITE_OTHER): Payer: Managed Care, Other (non HMO)

## 2020-04-17 VITALS — BP 112/78 | HR 65 | Ht 64.0 in | Wt 139.4 lb

## 2020-04-17 DIAGNOSIS — M25562 Pain in left knee: Secondary | ICD-10-CM | POA: Diagnosis not present

## 2020-04-17 DIAGNOSIS — G8929 Other chronic pain: Secondary | ICD-10-CM

## 2020-04-17 MED ORDER — PENNSAID 2 % EX SOLN: 1.0000 "application " | Freq: Two times a day (BID) | CUTANEOUS | 2 refills | Status: DC

## 2020-04-17 NOTE — Patient Instructions (Addendum)
Thank you for coming in today.  Please use voltaren gel up to 4x daily for pain as needed.   Pennsaid instructions: You have been given a sample/prescription for Pennsaid, a topical medication.     You are to apply this gel to your injured body part twice daily (morning and evening).   A little goes a long way so you can use about a pea-sized amount for each area.   Spread this small amount over the area into a thin film and let it dry.   Be sure that you do not rub the gel into your skin for more than 10 or 15 seconds otherwise it can irritate you skin.    Once you apply the gel, please do not put any other lotion or clothing in contact with that area for 30 minutes to allow the gel to absorb into your skin.   Some people are sensitive to the medication and can develop a sunburn-like rash.  If you have only mild symptoms it is okay to continue to use the medication but if you have any breakdown of your skin you should discontinue its use and please let us know.   If you have been written a prescription for Pennsaid, you will receive a pump bottle of this topical gel through a mail order pharmacy.  The instructions on the bottle will say to apply two pumps twice a day which may be too much gel for your particular area so use the pea-sized amount as your guide.  Instructions for Duexis, Pennsaid and Vimovo:  Your prescription will be filled through a participating [facility] mail order pharmacy.  You will receive a phone call or text from one of the participating pharmacies which can be located in any state in the [place].  You must communicate directly with them to have this medication filled.  When the pharmacy contacts you, they will need your mailing address (for shipment of the medication) andy they will need payment information if you have a copay (typically no more than $10). If you have not heard from them 2-3 days after your appointment with Dr. Georgina Snell, contact [facility]  directly at [phone].   Please get an Xray today before you leave Please get an Xray today before you leave  Nitroglycerin Protocol   Apply 1/4 nitroglycerin patch to affected area daily.  Change position of patch within the affected area every 24 hours.  You may experience a headache during the first 1-2 weeks of using the patch, these should subside.  If you experience headaches after beginning nitroglycerin patch treatment, you may take your preferred over the counter pain reliever.  Another side effect of the nitroglycerin patch is skin irritation or rash related to patch adhesive.  Please notify our office if you develop more severe headaches or rash, and stop the patch.  Tendon healing with nitroglycerin patch may require 12 to 24 weeks depending on the extent of injury.  Men should not use if taking Viagra, Cialis, or Levitra.   Do not use if you have migraines or rosacea.    Keep me updated.  If not improved let me know.  There is more to do.   Please perform the exercise program that we have prepared for you and gone over in detail on a daily basis.  In addition to the handout you were provided you can access your program through: [email or web address]   Your unique program code is:  [email or web address]

## 2020-04-17 NOTE — Progress Notes (Signed)
Subjective:    I'm seeing this patient as a consultation for:  Raiford Noble, PA-C. Note will be routed back to referring provider/PCP.  CC: L anterior knee pain  I, Molly Weber, LAT, ATC, am serving as scribe for Dr. Lynne Leader.  HPI: Pt is a 49 y/o female presenting w/ c/o L anterior knee pain since March 2021 when she knelt on a dog bone and has had increased swelling at her tibial tuberosity since.  She has a hx of Osgood-Schlatter's as an adolescent and is an avid runner.  She runs approximately 7-10 miles/week and runs marathons regularly.  R knee swelling: yes at the tibial tuberosity R knee mechanical symptoms:no Aggravating factors: kneeling; pressure to the tibial tuberosity; deep yoga squat Treatments tried: stretching; sports massage; foam rolling; ice; IBU; Biofreeze  Past medical history, Surgical history, Family history, Social history, Allergies, and medications have been entered into the medical record, reviewed.   Review of Systems: No new headache, visual changes, nausea, vomiting, diarrhea, constipation, dizziness, abdominal pain, skin rash, fevers, chills, night sweats, weight loss, swollen lymph nodes, body aches, joint swelling, muscle aches, chest pain, shortness of breath, mood changes, visual or auditory hallucinations.   Objective:    Vitals:   04/17/20 0950  BP: 112/78  Pulse: 65  SpO2: 99%   General: Well Developed, well nourished, and in no acute distress.  Neuro/Psych: Alert and oriented x3, extra-ocular muscles intact, able to move all 4 extremities, sensation grossly intact. Skin: Warm and dry, no rashes noted.  Respiratory: Not using accessory muscles, speaking in full sentences, trachea midline.  Cardiovascular: Pulses palpable, no extremity edema. Abdomen: Does not appear distended. MSK: Left knee nodule anterior knee anterior proximal tibia patellar tendon insertion. Normal-appearing otherwise. Normal motion with mild crepitation. Stable  ligamentous exam. Intact strength. Negative Murray's test. Mildly tender palpation overlying nodule at anterior proximal tibia.  Lab and Radiology Results X-ray images left knee obtained today personally and independently interpreted Significant calcifications distal patellar tendon insertion consistent with prior Osgood-Schlatter.  Minimal to absent DJD changes otherwise. Await formal radiology review  Diagnostic Limited MSK Ultrasound of: Left knee:  Quad tendon intact normal-appearing Patella tendon intact normal.  Except at insertion on the tibia were significant calcification changes are present.  Increased vascular activity on Doppler present. Medial joint line mildly narrowed.  Lateral joint line normal.   No Baker's cyst. 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  Plan for Pennsaid/Voltaren gel and eccentric exercises as taught in clinic today by ATC.  Will advance to nitroglycerin patches if not improving.  Also discussed possibility of injection or surgical possibilities into the future.  Recheck as needed.Marland Kitchen  PDMP not reviewed this encounter. Orders Placed This Encounter  Procedures  . [place] LIMITED JOINT SPACE STRUCTURES LOW RIGHT(NO LINKED CHARGES)    Order Specific Question:   Reason for Exam (SYMPTOM  OR DIAGNOSIS REQUIRED)    Answer:   L knee pain    Order Specific Question:   Preferred imaging location?    Answer:   [facility]  . DG Knee AP/LAT W/[place] Left    Standing Status:   Future    Number of Occurrences:   1    Standing Expiration Date:   04/17/2021    Order Specific Question:   Reason for Exam (SYMPTOM  OR DIAGNOSIS REQUIRED)    Answer:  eval ant knee pain    Order Specific Question:   Is patient pregnant?    Answer:   No    Order Specific Question:   Preferred imaging location?     Answer:   [facility]   Meds ordered this encounter  Medications  . Diclofenac Sodium (PENNSAID) 2 % SOLN    Sig: Place 1 application onto the skin 2 (two) times daily.    Dispense:  112 g    Refill:  2    Home Phone      [phone] Mobile          [phone]     Discussed warning signs or symptoms. Please see discharge instructions. Patient expresses understanding.   The above documentation has been reviewed and is accurate and complete Lynne Leader, M.D.

## 2020-04-20 NOTE — Progress Notes (Signed)
X-ray knee shows old changes from Osgood-Schlatter's and possible old avulsion fracture at the bump on the knee.  This is probably not causing the pain that you are experiencing now.  Continue current management.

## 2020-05-26 ENCOUNTER — Encounter: Payer: Self-pay | Admitting: Family Medicine

## 2020-06-01 ENCOUNTER — Other Ambulatory Visit: Payer: Self-pay | Admitting: Physician Assistant

## 2020-06-01 ENCOUNTER — Other Ambulatory Visit: Payer: Self-pay

## 2020-06-01 ENCOUNTER — Ambulatory Visit
Admission: RE | Admit: 2020-06-01 | Discharge: 2020-06-01 | Disposition: A | Discharge status: Home / Self Care | Payer: Managed Care, Other (non HMO) | Source: Ambulatory Visit | Attending: Physician Assistant | Admitting: Physician Assistant

## 2020-06-01 DIAGNOSIS — Z1231 Encounter for screening mammogram for malignant neoplasm of breast: Secondary | ICD-10-CM

## 2020-06-03 NOTE — Progress Notes (Signed)
   I, Peterson Lombard, LAT, ATC acting as a scribe for Lynne Leader, MD.  Theresa Raymond is a 49 y.o. female who presents to [place] at [facility] today for f/u chronic L knee pain. Knee pain has been ongoing since March 2021 after kneeling on a dog bone and experienced swelling at her tibial tuberosity since. Pt has a hx Osgood-Schlatter's as an adolescent and is an avid runner. She runs approximately 7-10 miles/wk and runs marathons regularly. Pt was last seen by Dr. Georgina Snell on 04/17/20 and was shown a HEP focusing on quad strengthening.  She was also advised to use a knee compression sleeve and Voltaren gel or Pennsaid. Today, pt reports knee is about the same. Pt locates pain to patellar tendon area and tibial tuberosity.   R knee swelling: yes at tibial tuberosity Aggravating factors: kneeling, pressure to the tibial tuberosity, deep yoga squat Rx tried: stretching; sports massage, foam rolling, ice, IBU, Biofreeze  Dx imaging: 04/17/20 L knee XR  Pertinent review of systems: No fevers or chills  Relevant historical information: Hypertension no severe headache issues.   Exam:  BP 122/78 (BP Location: Right Arm, Patient Position: Sitting, Cuff Size: Normal)   Pulse (!) 55   Ht 5\' 4"  (1.626 m)   Wt 140 lb 6.4 oz (63.7 kg)   SpO2 100%   BMI 24.10 kg/m  General: Well Developed, well nourished, and in no acute distress.   MSK: Left knee prominent nodule proximal anterior tibia patellar tendon insertion mildly tender.  Normal knee motion.  Normal gait.     Assessment and Plan: 49 y.o. female with calcific patellar tendinitis at tibia insertion secondary to [employer] as a child.  Failing early conservative management strategies.  Will broaden to include nitroglycerin patch protocol.  If not improved in about a month we will proceed with MRI.  Patient will contact me if not improved.   PDMP not reviewed this encounter. No orders of the defined types were  placed in this encounter.  Meds ordered this encounter  Medications  . nitroGLYCERIN (NITRODUR - DOSED IN MG/24 HR) 0.2 mg/hr patch    Sig: Apply 1/4 patch daily to tendon for tendonitis.    Dispense:  30 patch    Refill:  1     Discussed warning signs or symptoms. Please see discharge instructions. Patient expresses understanding.   The above documentation has been reviewed and is accurate and complete Lynne Leader, M.D.

## 2020-06-08 ENCOUNTER — Other Ambulatory Visit: Payer: Self-pay

## 2020-06-08 ENCOUNTER — Ambulatory Visit: Payer: Managed Care, Other (non HMO) | Admitting: Family Medicine

## 2020-06-08 VITALS — BP 122/78 | HR 55 | Ht 64.0 in | Wt 140.4 lb

## 2020-06-08 DIAGNOSIS — M25562 Pain in left knee: Secondary | ICD-10-CM | POA: Diagnosis not present

## 2020-06-08 DIAGNOSIS — G8929 Other chronic pain: Secondary | ICD-10-CM | POA: Diagnosis not present

## 2020-06-08 MED ORDER — NITROGLYCERIN 0.2 MG/HR TD PT24: MEDICATED_PATCH | TRANSDERMAL | 1 refills | Status: DC

## 2020-06-08 NOTE — Patient Instructions (Signed)
Thank you for coming in today.  Continue the home exercises. Load that tendon.   If you knee is not significantly improving in 1 month let me know.  I will order and MRI.   Nitroglycerin Protocol   Apply 1/4 nitroglycerin patch to affected area daily.  Change position of patch within the affected area every 24 hours.  You may experience a headache during the first 1-2 weeks of using the patch, these should subside.  If you experience headaches after beginning nitroglycerin patch treatment, you may take your preferred over the counter pain reliever.  Another side effect of the nitroglycerin patch is skin irritation or rash related to patch adhesive.  Please notify our office if you develop more severe headaches or rash, and stop the patch.  Tendon healing with nitroglycerin patch may require 12 to 24 weeks depending on the extent of injury.  Men should not use if taking Viagra, Cialis, or Levitra.   Do not use if you have migraines or rosacea.

## 2020-06-25 ENCOUNTER — Other Ambulatory Visit: Payer: Self-pay | Admitting: Cardiovascular Disease

## 2020-07-10 ENCOUNTER — Other Ambulatory Visit: Payer: Self-pay

## 2020-07-10 ENCOUNTER — Ambulatory Visit
Admission: RE | Admit: 2020-07-10 | Discharge: 2020-07-10 | Disposition: A | Discharge status: Home / Self Care | Payer: Managed Care, Other (non HMO) | Source: Ambulatory Visit | Attending: Physician Assistant | Admitting: Physician Assistant

## 2020-07-10 LAB — HM MAMMOGRAPHY

## 2020-07-21 ENCOUNTER — Other Ambulatory Visit: Payer: Self-pay

## 2020-07-21 ENCOUNTER — Ambulatory Visit (AMBULATORY_SURGERY_CENTER): Payer: Self-pay | Admitting: *Deleted

## 2020-07-21 VITALS — Ht 64.0 in | Wt 141.0 lb

## 2020-07-21 DIAGNOSIS — Z1211 Encounter for screening for malignant neoplasm of colon: Secondary | ICD-10-CM

## 2020-07-21 MED ORDER — PLENVU 140 G PO SOLR: 1.0000 | ORAL | 0 refills | Status: DC

## 2020-07-21 NOTE — Progress Notes (Signed)
No egg or soy allergy known to patient  No issues with past sedation with any surgeries or procedures No intubation problems in the past  No FH of Malignant Hyperthermia No diet pills per patient No home 02 use per patient  No blood thinners per patient  Pt denies issues with constipation  No A fib or A flutter  EMMI video to pt or via [place] 19 guidelines implemented in PV today with Pt and RN  Pt is fully vaccinated  for Covid   Plenvu not covered- only prep covered is Golytely-  Dr Henrene Pastor does not allowed Golytely for his pt's- gave Plenvu sample- Lot [id number]- Exp 09-2020   Due to the COVID-19 pandemic we are asking patients to follow certain guidelines.  Pt aware of COVID protocols and [facility] guidelines

## 2020-07-31 ENCOUNTER — Encounter: Payer: Self-pay | Admitting: Internal Medicine

## 2020-08-04 ENCOUNTER — Encounter: Payer: Self-pay | Admitting: Internal Medicine

## 2020-08-04 ENCOUNTER — Ambulatory Visit (AMBULATORY_SURGERY_CENTER): Payer: Managed Care, Other (non HMO) | Admitting: Internal Medicine

## 2020-08-04 ENCOUNTER — Other Ambulatory Visit: Payer: Self-pay

## 2020-08-04 VITALS — BP 125/77 | HR 57 | Temp 97.8°F | Resp 8 | Ht 64.0 in | Wt 141.0 lb

## 2020-08-04 DIAGNOSIS — D122 Benign neoplasm of ascending colon: Secondary | ICD-10-CM

## 2020-08-04 DIAGNOSIS — K621 Rectal polyp: Secondary | ICD-10-CM

## 2020-08-04 DIAGNOSIS — Z1211 Encounter for screening for malignant neoplasm of colon: Secondary | ICD-10-CM | POA: Diagnosis present

## 2020-08-04 DIAGNOSIS — K635 Polyp of colon: Secondary | ICD-10-CM | POA: Diagnosis not present

## 2020-08-04 DIAGNOSIS — D128 Benign neoplasm of rectum: Secondary | ICD-10-CM

## 2020-08-04 LAB — HM COLONOSCOPY

## 2020-08-04 MED ORDER — SODIUM CHLORIDE 0.9 % IV SOLN: 500.0000 mL | Freq: Once | INTRAVENOUS | Status: DC

## 2020-08-04 NOTE — Progress Notes (Signed)
Called to room to assist during endoscopic procedure.  Patient ID and intended procedure confirmed with present staff. Received instructions for my participation in the procedure from the performing physician.  

## 2020-08-04 NOTE — Patient Instructions (Signed)
Discharge instructions given. Handout on polyps. Resume previous medications. YOU HAD AN ENDOSCOPIC PROCEDURE TODAY AT THE [facility]:   Refer to the procedure report that was given to you for any specific questions about what was found during the examination.  If the procedure report does not answer your questions, please call your gastroenterologist to clarify.  If you requested that your care partner not be given the details of your procedure findings, then the procedure report has been included in a sealed envelope for you to review at your convenience later.  YOU SHOULD EXPECT: Some feelings of bloating in the abdomen. Passage of more gas than usual.  Walking can help get rid of the air that was put into your GI tract during the procedure and reduce the bloating. If you had a lower endoscopy (such as a colonoscopy or flexible sigmoidoscopy) you may notice spotting of blood in your stool or on the toilet paper. If you underwent a bowel prep for your procedure, you may not have a normal bowel movement for a few days.  Please Note:  You might notice some irritation and congestion in your nose or some drainage.  This is from the oxygen used during your procedure.  There is no need for concern and it should clear up in a day or so.  SYMPTOMS TO REPORT IMMEDIATELY:  Following lower endoscopy (colonoscopy or flexible sigmoidoscopy):  Excessive amounts of blood in the stool  Significant tenderness or worsening of abdominal pains  Swelling of the abdomen that is new, acute  Fever of 100F or higher   For urgent or emergent issues, a gastroenterologist can be reached at any hour by calling [phone]. Do not use MyChart messaging for urgent concerns.    DIET:  We do recommend a small meal at first, but then you may proceed to your regular diet.  Drink plenty of fluids but you should avoid alcoholic beverages for 24 hours.  ACTIVITY:  You should plan to take it easy for the rest  of today and you should NOT DRIVE or use heavy machinery until tomorrow (because of the sedation medicines used during the test).    FOLLOW UP: Our staff will call the number listed on your records 48-72 hours following your procedure to check on you and address any questions or concerns that you may have regarding the information given to you following your procedure. If we do not reach you, we will leave a message.  We will attempt to reach you two times.  During this call, we will ask if you have developed any symptoms of COVID 19. If you develop any symptoms (ie: fever, flu-like symptoms, shortness of breath, cough etc.) before then, please call [phone].  If you test positive for Covid 19 in the 2 weeks post procedure, please call and report this information to us.    If any biopsies were taken you will be contacted by phone or by letter within the next 1-3 weeks.  Please call us at [phone] if you have not heard about the biopsies in 3 weeks.    SIGNATURES/CONFIDENTIALITY: You and/or your care partner have signed paperwork which will be entered into your electronic medical record.  These signatures attest to the fact that that the information above on your After Visit Summary has been reviewed and is understood.  Full responsibility of the confidentiality of this discharge information lies with you and/or your care-partner.  

## 2020-08-04 NOTE — Progress Notes (Signed)
pt tolerated well. VSS. awake and to recovery. Report given to RN.  

## 2020-08-04 NOTE — Op Note (Signed)
[place] Patient Name: Theresa Raymond Single Procedure Date: 08/04/2020 1:29 PM MRN: [id number] Endoscopist: Docia Chuck. Henrene Pastor , MD Age: 50 Referring MD:  Date of Birth: 1971-02-23 Gender: Female Account #: [id number] Procedure:                Colonoscopy with cold snare polypectomy x 2 Indications:              Screening for colorectal malignant neoplasm Medicines:                Monitored Anesthesia Care Procedure:                Pre-Anesthesia Assessment:                           - Prior to the procedure, a History and Physical                            was performed, and patient medications and                            allergies were reviewed. The patient's tolerance of                            previous anesthesia was also reviewed. The risks                            and benefits of the procedure and the sedation                            options and risks were discussed with the patient.                            All questions were answered, and informed consent                            was obtained. Prior Anticoagulants: The patient has                            taken no previous anticoagulant or antiplatelet                            agents. After reviewing the risks and benefits, the                            patient was deemed in satisfactory condition to                            undergo the procedure.                           After obtaining informed consent, the colonoscope                            was passed under direct vision. Throughout the  procedure, the patient's blood pressure, pulse, and                            oxygen saturations were monitored continuously. The                            Olympus CF-HQ190 [id number]) Colonoscope was                            introduced through the anus and advanced to the the                            cecum, identified by appendiceal orifice and                             ileocecal valve. The ileocecal valve, appendiceal                            orifice, and rectum were photographed. The quality                            of the bowel preparation was excellent. The                            colonoscopy was performed without difficulty. The                            patient tolerated the procedure well. The bowel                            preparation used was Plenvu via split dose                            instruction. Scope In: 1:46:05 PM Scope Out: 2:03:25 PM Scope Withdrawal Time: 0 hours 12 minutes 57 seconds  Total Procedure Duration: 0 hours 17 minutes 20 seconds  Findings:                 Two polyps were found in the rectum and ascending                            colon. The polyps were 2 to 5 mm in size. These                            polyps were removed with a cold snare. Resection                            and retrieval were complete.                           The exam was otherwise without abnormality on                            direct and retroflexion views. Complications:  No immediate complications. Estimated blood loss:                            None. Estimated Blood Loss:     Estimated blood loss: none. Impression:               - Two 2 to 5 mm polyps in the rectum and in the                            ascending colon, removed with a cold snare.                            Resected and retrieved.                           - The examination was otherwise normal on direct                            and retroflexion views. Recommendation:           - Repeat colonoscopy in 5-10 years for                            surveillance, based on final pathology.                           - Patient has a contact number available for                            emergencies. The signs and symptoms of potential                            delayed complications were discussed with the                            patient. Return to normal  activities tomorrow.                            Written discharge instructions were provided to the                            patient.                           - Resume previous diet.                           - Continue present medications.                           - Await pathology results. Docia Chuck. Henrene Pastor, MD 08/04/2020 2:09:10 PM This report has been signed electronically.

## 2020-08-04 NOTE — Progress Notes (Signed)
Pt's states no medical or surgical changes since previsit or office visit.   VS taken by CW 

## 2020-08-06 ENCOUNTER — Encounter: Payer: Self-pay | Admitting: Family Medicine

## 2020-08-06 ENCOUNTER — Telehealth: Payer: Self-pay | Admitting: *Deleted

## 2020-08-06 DIAGNOSIS — G8929 Other chronic pain: Secondary | ICD-10-CM

## 2020-08-06 DIAGNOSIS — M25562 Pain in left knee: Secondary | ICD-10-CM

## 2020-08-06 NOTE — Telephone Encounter (Signed)
  Follow up Call-  Call back number 08/04/2020  Post procedure Call Back phone  # [phone]  Permission to leave phone message Yes  Some recent data might be hidden     Patient questions:  Do you have a fever, pain , or abdominal swelling? No. Pain Score  0 *  Have you tolerated food without any problems? Yes.    Have you been able to return to your normal activities? Yes.    Do you have any questions about your discharge instructions: Diet   No. Medications  No. Follow up visit  No.  Do you have questions or concerns about your Care? No.  Actions: * If pain score is 4 or above: 1. No action needed, pain <4.Have you developed a fever since your procedure? no  2.   Have you had an respiratory symptoms (SOB or cough) since your procedure? no  3.   Have you tested positive for COVID 19 since your procedure no  4.   Have you had any family members/close contacts diagnosed with the COVID 19 since your procedure?  no   If yes to any of these questions please route to Joylene John, RN and Joella Prince, RN

## 2020-08-10 ENCOUNTER — Encounter: Payer: Self-pay | Admitting: Internal Medicine

## 2020-08-15 ENCOUNTER — Ambulatory Visit (INDEPENDENT_AMBULATORY_CARE_PROVIDER_SITE_OTHER): Payer: Managed Care, Other (non HMO)

## 2020-08-15 ENCOUNTER — Other Ambulatory Visit: Payer: Self-pay

## 2020-08-15 DIAGNOSIS — S83282A Other tear of lateral meniscus, current injury, left knee, initial encounter: Secondary | ICD-10-CM

## 2020-08-15 DIAGNOSIS — R600 Localized edema: Secondary | ICD-10-CM

## 2020-08-15 DIAGNOSIS — M25562 Pain in left knee: Secondary | ICD-10-CM

## 2020-08-15 DIAGNOSIS — G8929 Other chronic pain: Secondary | ICD-10-CM

## 2020-08-18 NOTE — Progress Notes (Signed)
MRI left knee shows chronic fragmentation of the tibial tuberosity reflects sequelae of Osgood-Schlatter with bone marrow edema throughout one of the components that is the primary cause of your pain.  Additionally you have a meniscus tear and parameniscal cysts of the lateral meniscus and some cartilage loss underneath the kneecap.  Recommend you return to clinic to go over the results in full detail.

## 2020-08-19 NOTE — Progress Notes (Signed)
I, Peterson Lombard, LAT, ATC acting as a scribe for Lynne Leader, MD.  Theresa Raymond is a 50 y.o. female who presents to [place] [facility] today for f/u chronic L knee pain and L knee MRI review. Pt was last seen by Dr. Georgina Snell on 06/08/20 and was advised to try nitro patches and proceed to MRI if not improving. She has been shown a HEP focusing on quad strengthening and advised to wear a knee compression sleeve and try either Voltaren or Pennsaid.  Today, pt reports that her L knee pain remains about the same, very up and down in terms of symptoms, some days better, some days worse.  Pain occurred about a year ago when she kneeled on a hard object. She is having trouble getting the pain under control since then despite conservative management including physical therapy.  She does not have a lot of pain in the patellofemoral area or the lateral joint line. Majority of pain is in the anterior knee at the distal patellar tendon nodule.  Dx imaging: 08/15/20 L knee MRI  04/17/20 L knee XR  Pertinent review of systems: No fevers or chills  Relevant historical information: Hypertension   Exam:  BP 138/78 (BP Location: Right Arm, Patient Position: Sitting, Cuff Size: Normal)   Pulse (!) 56   Ht 5\' 4"  (1.626 m)   Wt 138 lb 6.4 oz (62.8 kg)   LMP 07/30/2020 (Approximate)   SpO2 98%   BMI 23.76 kg/m  General: Well Developed, well nourished, and in no acute distress.   MSK: Left knee visible nodule anterior knee distal patellar tendon. Tender palpation.    Lab and Radiology Results  EXAM: MRI OF THE LEFT KNEE WITHOUT CONTRAST  TECHNIQUE: Multiplanar, multisequence MR imaging of the knee was performed. No intravenous contrast was administered.  COMPARISON:  X-ray 04/17/2020  FINDINGS: MENISCI  Medial meniscus:  Mild intrasubstance degeneration without tear.  Lateral meniscus: Horizontal cleavage tear of the lateral meniscal body without displaced  flap or fragment (series 6, images 13-16). Parameniscal cyst located along the peripheral margin of the lateral meniscal body measuring 13 x 4 x 7 mm.  LIGAMENTS  Cruciates:  Intact ACL and PCL.  Collaterals: Medial collateral ligament is intact. Lateral collateral ligament complex is intact.  CARTILAGE  Patellofemoral: Partial-thickness chondral loss of the lateral patellar facet with possible delamination component (series 3, image 8).  Medial:  No chondral defect.  Lateral:  No chondral defect.  Joint: No joint effusion. Fat pads within normal limits. No infrapatellar bursitis.  Popliteal Fossa:  No Baker cyst. Intact popliteus tendon.  Extensor Mechanism: Intact quadriceps tendon and patellar tendon. Chronic fragmentation of the tibial tuberosity with bone marrow edema present throughout the 1.1 cm more inferiorly located osseous fragment (series 8, image 16).  Bones: Tibial tuberosity fragmentation and marrow edema, as above. Remaining osseous structures are within normal limits. No fracture or dislocation.  Other: No soft tissue edema or fluid collection.  IMPRESSION: 1. Horizontal tear of the lateral meniscal body with adjacent 13 mm parameniscal cyst. 2. Chronic fragmentation of the tibial tuberosity, likely reflecting sequela of Osgood-Schlatter disease. Bone marrow edema throughout the more inferiorly located osseous fragment, which may be reactive or reflect contusion. 3. Partial-thickness chondral loss of the lateral patellar facet with possible delamination component.   Electronically Signed   By: Davina Poke D.O.   On: 08/17/2020 08:21  I, Lynne Leader, personally (independently) visualized and performed the interpretation of the images  attached in this note.    Assessment and Plan: 50 y.o. female with left knee pain anterior knee pain due to nodule. Fundamentally patient is not improving with conservative management. I fear  that she will not be able to improve that irritated bone fragment in the anterior aspect of the knee. At this point refer to orthopedic surgery to consider surgical removal.  She does have a lateral meniscus tear and parameniscal cyst but I don't think that is causing much of her pain at all.  Recheck back with me as needed.   PDMP not reviewed this encounter. Orders Placed This Encounter  Procedures  . Ambulatory referral to Orthopedic Surgery    Referral Priority:   Routine    Referral Type:   Surgical    Referral Reason:   Specialty Services Required    Requested Specialty:   Orthopedic Surgery    Number of Visits Requested:   1   No orders of the defined types were placed in this encounter.    Discussed warning signs or symptoms. Please see discharge instructions. Patient expresses understanding.   The above documentation has been reviewed and is accurate and complete Lynne Leader, M.D.  Total encounter time 20 minutes including face-to-face time with the patient and, reviewing past medical record, and charting on the date of service.   Discussed MRI findings treatment plan and options.

## 2020-08-20 ENCOUNTER — Other Ambulatory Visit: Payer: Self-pay

## 2020-08-20 ENCOUNTER — Encounter: Payer: Self-pay | Admitting: Family Medicine

## 2020-08-20 ENCOUNTER — Ambulatory Visit: Payer: Managed Care, Other (non HMO) | Admitting: Family Medicine

## 2020-08-20 VITALS — BP 138/78 | HR 56 | Ht 64.0 in | Wt 138.4 lb

## 2020-08-20 DIAGNOSIS — M7652 Patellar tendinitis, left knee: Secondary | ICD-10-CM

## 2020-08-20 NOTE — Patient Instructions (Signed)
Thank you for coming in today.  You should hear from orthopedics.   Let me know if you do not hear soon.

## 2020-08-26 ENCOUNTER — Other Ambulatory Visit: Payer: Self-pay | Admitting: Physician Assistant

## 2020-08-28 ENCOUNTER — Ambulatory Visit (INDEPENDENT_AMBULATORY_CARE_PROVIDER_SITE_OTHER): Payer: Managed Care, Other (non HMO) | Admitting: Orthopaedic Surgery

## 2020-08-28 ENCOUNTER — Encounter: Payer: Self-pay | Admitting: Orthopaedic Surgery

## 2020-08-28 VITALS — Ht 63.0 in | Wt 135.0 lb

## 2020-08-28 DIAGNOSIS — M67969 Unspecified disorder of synovium and tendon, unspecified lower leg: Secondary | ICD-10-CM

## 2020-08-28 DIAGNOSIS — Z8739 Personal history of other diseases of the musculoskeletal system and connective tissue: Secondary | ICD-10-CM | POA: Diagnosis not present

## 2020-08-28 NOTE — Progress Notes (Addendum)
Office Visit Note   Patient: Theresa Raymond           Date of Birth: [birth year]           MRN: [id number] Visit Date: 08/28/2020              Requested by: Theresa Hams, MD [place],  [place] [address] PCP: Theresa Jeans, PA-C   Assessment & Plan: Visit Diagnoses:  1. History of Osgood-Schlatter disease   2. 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  I reviewed the MRI and she does have significant bony edema to the proximal tibia and multiple ossicles from the history of Osgood-Schlatter's disease.  There is tendinopathy seen on MRI as well.  Incidental finding of lateral meniscus tear and parameniscal cyst that is not symptomatic.  Based on discussion of treatment options to include continued conservative treatment versus surgical treatment in the form of patella tendon debridement and removal of ossicles with repair back to the bone the patient has elected to move forward with surgical treatment in April after vacation to [employer].  Risk benefits rehab recovery reviewed with the patient in detail.  All questions answered.  Total face to face encounter time was greater than 45 minutes and over half of this time was spent in counseling and/or coordination of care.  Follow-Up Instructions: Return if symptoms worsen or fail to improve.   Orders:  No orders of the defined types were placed in this encounter.  No orders of the defined types were placed in this encounter.     Procedures: No procedures performed   Clinical Data: No additional findings.   Subjective: Chief Complaint  Patient presents with   Left Knee - Pain    [name] is a very pleasant and active 50 year old female comes in for evaluation of chronic left knee pain since adolescence.  She has dealt with this for quite some time and took a long period of time off  recently from running get better.  Overall she has had severe constant pain including a year.  She is a referral from [place].  She recently had an MRI of the left knee.  She denies any mechanical symptoms.  She endorses severe pain anteriorly over the tibial tubercle.   Review of Systems  Constitutional: Negative.   HENT: Negative.   Eyes: Negative.   Respiratory: Negative.   Cardiovascular: Negative.   Endocrine: Negative.   Musculoskeletal: Negative.   Neurological: Negative.   Hematological: Negative.   Psychiatric/Behavioral: Negative.   All other systems reviewed and are negative.    Objective: Vital Signs: Ht 5\' 3"  (1.6 m)    Wt 135 lb (61.2 kg)    LMP 07/30/2020 (Approximate)    BMI 23.91 kg/m   Physical Exam Vitals and nursing note reviewed.  Constitutional:      Appearance: She is well-developed.  HENT:     Head: Normocephalic and atraumatic.  Pulmonary:     Effort: Pulmonary effort is normal.  Abdominal:     Palpations: Abdomen is soft.  Musculoskeletal:     Cervical back: Neck supple.  Skin:    General: Skin is warm.     Capillary Refill: Capillary refill takes less than 2 seconds.  Neurological:     Mental Status: She is alert and oriented to person, place, and time.  Psychiatric:  Behavior: Behavior normal.        Thought Content: Thought content normal.        Judgment: Judgment normal.     Ortho Exam Left knee exam shows no joint effusion.  She has mild lateral joint line tenderness negative McMurray.  Collaterals and cruciates are stable.  She has exquisite tenderness directly over the insertion of the patella tendon and the tibial tubercle.  There is some soft tissue crepitus there.  Range of motion is supple with increased pain with flexion. Specialty Comments:  No specialty comments available.  Imaging: No results found.   PMFS History: Patient Active Problem List   Diagnosis Date Noted   Tendinopathy of patella 08/28/2020    History of Osgood-Schlatter disease 08/28/2020   Eczema 03/11/2019   Chronic chest pain 09/18/2018   HTN (hypertension) 09/18/2018   Visit for preventive health examination 03/01/2018   Hypothyroidism 03/01/2018   Past Medical History:  Diagnosis Date   Anemia    Chest pain    ETT 03/2019: Ex 13', no ECG changes   Chicken pox    Heart murmur    Hypertension    Migraines    Thyroid disease     Family History  Problem Relation Age of Onset   Depression Mother    Diabetes Mother    Miscarriages / [place] Mother    Hearing loss Father    Stroke Father    Cancer Maternal Grandfather    Hyperlipidemia Maternal Grandfather    Lung cancer Maternal Grandfather    Stroke Paternal Grandmother    Early death Paternal Grandfather        Polio   Colon cancer Neg Hx    Colon polyps Neg Hx    Esophageal cancer Neg Hx    Rectal cancer Neg Hx    Stomach cancer Neg Hx     Past Surgical History:  Procedure Laterality Date   CERVIX SURGERY  1992   CLAVICLE SURGERY  2006   Social History   Occupational History   Not on file  Tobacco Use   Smoking status: Never Smoker   Smokeless tobacco: Never Used  Vaping Use   Vaping Use: Never used  Substance and Sexual Activity   Alcohol use: Yes    Alcohol/week: 15.0 standard drinks    Types: 14 Glasses of wine, 1 Cans of beer per week    Comment: Has wine daily, occasional beer and liquor   Drug use: No   Sexual activity: Yes

## 2020-09-14 ENCOUNTER — Telehealth: Payer: Self-pay | Admitting: Orthopaedic Surgery

## 2020-09-14 NOTE — Telephone Encounter (Signed)
Patient is scheduled for left knee patellar tendon debridement/repair at [facility] on April 21st at 12pm with Dr. Erlinda Hong. Patient is scheduled to receive 2nd Shingles Vaccine on 18th of April and asking if this is ok in relation to date of surgery.

## 2020-09-14 NOTE — Telephone Encounter (Signed)
yes

## 2020-09-14 NOTE — Telephone Encounter (Signed)
Please advise 

## 2020-09-15 NOTE — Telephone Encounter (Signed)
I called patient and advised. 

## 2020-10-05 ENCOUNTER — Other Ambulatory Visit: Payer: Self-pay

## 2020-10-05 ENCOUNTER — Ambulatory Visit: Payer: Managed Care, Other (non HMO) | Admitting: Internal Medicine

## 2020-10-05 ENCOUNTER — Encounter: Payer: Self-pay | Admitting: Internal Medicine

## 2020-10-05 VITALS — BP 138/86 | HR 79 | Temp 98.0°F | Resp 16 | Ht 63.0 in | Wt 139.0 lb

## 2020-10-05 DIAGNOSIS — Z23 Encounter for immunization: Secondary | ICD-10-CM | POA: Diagnosis not present

## 2020-10-05 DIAGNOSIS — E039 Hypothyroidism, unspecified: Secondary | ICD-10-CM | POA: Diagnosis not present

## 2020-10-05 DIAGNOSIS — I10 Essential (primary) hypertension: Secondary | ICD-10-CM

## 2020-10-05 LAB — BASIC METABOLIC PANEL
BUN: 15 mg/dL (ref 6–23)
CO2: 28 mEq/L (ref 19–32)
Calcium: 9.7 mg/dL (ref 8.4–10.5)
Chloride: 101 mEq/L (ref 96–112)
Creatinine, Ser: 0.69 mg/dL (ref 0.40–1.20)
GFR: 99.95 mL/min (ref >60.00)
Glucose, Bld: 99 mg/dL (ref 70–99)
Potassium: 4 mEq/L (ref 3.5–5.1)
Sodium: 138 mEq/L (ref 135–145)

## 2020-10-05 LAB — TSH: TSH: 1.09 u[IU]/mL (ref 0.35–4.50)

## 2020-10-05 NOTE — Progress Notes (Signed)
Subjective:  Patient ID: Theresa Raymond, female    DOB: May 27, 1971  Age: 50 y.o. MRN: [id number]  CC: Hypertension and Hypothyroidism  This visit occurred during the SARS-CoV-2 public health emergency.  Safety protocols were in place, including screening questions prior to the visit, additional usage of staff PPE, and extensive cleaning of exam room while observing appropriate contact time as indicated for disinfecting solutions.    HPI Theresa Raymond presents for f/up and to establish -  She tells me that her BP has been well controlled. She is a runner and does not experience CP/DOE/palpitations/edema/fatigue.  Outpatient Medications Prior to Visit  Medication Sig Dispense Refill  . Diclofenac Sodium (PENNSAID) 2 % SOLN Place 1 application onto the skin 2 (two) times daily. 112 g 2  . hydrochlorothiazide (MICROZIDE) 12.5 MG capsule TAKE 1 CAPSULE BY MOUTH EVERY DAY 30 capsule 7  . levothyroxine (SYNTHROID) 100 MCG tablet TAKE 1 TABLET (100 MCG TOTAL) BY MOUTH DAILY BEFORE BREAKFAST. 90 tablet 1  . Multiple Vitamins-Minerals (MULTIVITAMIN ADULT PO) Take 1 tablet by mouth daily.    . nitroGLYCERIN (NITRODUR - DOSED IN MG/24 HR) 0.2 mg/hr patch Apply 1/4 patch daily to tendon for tendonitis. 30 patch 1  . triamcinolone cream (KENALOG) 0.1 % Apply 1 application topically as needed (for skin irritation). 30 g 1  . valsartan (DIOVAN) 160 MG tablet TAKE 1 TABLET BY MOUTH EVERY DAY 30 tablet 11   No facility-administered medications prior to visit.    ROS Review of Systems  Constitutional: Negative for diaphoresis, fatigue and unexpected weight change.  HENT: Negative.   Eyes: Negative.   Respiratory: Negative for cough, chest tightness, shortness of breath and wheezing.   Cardiovascular: Negative for chest pain, palpitations and leg swelling.  Gastrointestinal: Negative for abdominal pain, constipation and diarrhea.  Endocrine: Negative for cold intolerance and heat intolerance.   Genitourinary: Negative.   Musculoskeletal: Negative.   Skin: Negative.   Neurological: Negative.  Negative for dizziness, weakness, light-headedness and headaches.  Hematological: Negative for adenopathy. Does not bruise/bleed easily.  Psychiatric/Behavioral: Negative.     Objective:  BP 138/86 (BP Location: Right Arm, Patient Position: Sitting, Cuff Size: Large)   Pulse 79   Temp 98 F (36.7 C) (Oral)   Resp 16   Ht 5\' 3"  (1.6 m)   Wt 139 lb (63 kg)   SpO2 99%   BMI 24.62 kg/m   BP Readings from Last 3 Encounters:  10/05/20 138/86  08/20/20 138/78  08/04/20 125/77    Wt Readings from Last 3 Encounters:  10/05/20 139 lb (63 kg)  08/28/20 135 lb (61.2 kg)  08/20/20 138 lb 6.4 oz (62.8 kg)    Physical Exam Vitals reviewed.  HENT:     Nose: Nose normal.     Mouth/Throat:     Mouth: Mucous membranes are moist.  Eyes:     General: No scleral icterus.    Conjunctiva/sclera: Conjunctivae normal.  Cardiovascular:     Rate and Rhythm: Normal rate and regular rhythm.     Heart sounds: No murmur heard.   Pulmonary:     Effort: Pulmonary effort is normal.     Breath sounds: No stridor. No wheezing, rhonchi or rales.  Abdominal:     General: Abdomen is flat. Bowel sounds are normal. There is no distension.     Palpations: Abdomen is soft. There is no hepatomegaly or splenomegaly.  Musculoskeletal:        General: Normal range of motion.  Cervical back: Neck supple.     Right lower leg: No edema.     Left lower leg: No edema.  Lymphadenopathy:     Cervical: No cervical adenopathy.  Skin:    General: Skin is warm and dry.     Coloration: Skin is not pale.  Neurological:     General: No focal deficit present.     Mental Status: She is alert.  Psychiatric:        Mood and Affect: Mood normal.        Behavior: Behavior normal.     Lab Results  Component Value Date   WBC 4.6 04/15/2020   HGB 13.7 04/15/2020   HCT 39.8 04/15/2020   PLT 282.0 04/15/2020    GLUCOSE 99 10/05/2020   CHOL 210 (H) 04/15/2020   TRIG 50.0 04/15/2020   HDL 110.90 04/15/2020   LDLCALC 89 04/15/2020   ALT 10 04/15/2020   AST 20 04/15/2020   NA 138 10/05/2020   K 4.0 10/05/2020   CL 101 10/05/2020   CREATININE 0.69 10/05/2020   BUN 15 10/05/2020   CO2 28 10/05/2020   TSH 1.09 10/05/2020   HGBA1C 5.5 04/15/2020    MM 3D SCREEN BREAST BILATERAL  Result Date: 07/10/2020 CLINICAL DATA:  Screening. EXAM: DIGITAL SCREENING BILATERAL MAMMOGRAM WITH TOMO AND CAD COMPARISON:  Previous exam(s). ACR Breast Density Category b: There are scattered areas of fibroglandular density. FINDINGS: There are no findings suspicious for malignancy. The images were evaluated with computer-aided detection. IMPRESSION: No mammographic evidence of malignancy. A result letter of this screening mammogram will be mailed directly to the patient. RECOMMENDATION: Screening mammogram in one year. (Code:SM-B-01Y) BI-RADS CATEGORY  1: Negative. Electronically Signed   By: Ammie Ferrier M.D.   On: 07/10/2020 16:39    Assessment & Plan:   Theresa Raymond was seen today for hypertension and hypothyroidism.  Diagnoses and all orders for this visit:  Primary hypertension- Her blood pressure is well controlled.  Electrolytes and renal function are normal.  Will continue the combination of an ARB and thiazide diuretic. -     Basic metabolic panel; Future -     Basic metabolic panel  Acquired hypothyroidism- Her TSH is in the normal range.  She will stay on the current dose of levothyroxine. -     TSH; Future -     TSH  Other orders -     Varicella-zoster vaccine IM (Shingrix)   I am having Theresa Raymond maintain [name], triamcinolone cream, Pennsaid, levothyroxine, nitroGLYCERIN, hydrochlorothiazide, and valsartan.  No orders of the defined types were placed in this encounter.    Follow-up: Return in about 6 months (around 04/06/2021).  [name], MD

## 2020-10-05 NOTE — Patient Instructions (Signed)

## 2020-10-07 ENCOUNTER — Other Ambulatory Visit: Payer: Self-pay | Admitting: Physician Assistant

## 2020-10-07 MED ORDER — ONDANSETRON HCL 4 MG PO TABS: 4.0000 mg | ORAL_TABLET | Freq: Three times a day (TID) | ORAL | 0 refills | Status: DC | PRN

## 2020-10-07 MED ORDER — HYDROCODONE-ACETAMINOPHEN 5-325 MG PO TABS: 1.0000 | ORAL_TABLET | Freq: Four times a day (QID) | ORAL | 0 refills | Status: DC | PRN

## 2020-10-08 DIAGNOSIS — Z8739 Personal history of other diseases of the musculoskeletal system and connective tissue: Secondary | ICD-10-CM | POA: Diagnosis not present

## 2020-10-08 DIAGNOSIS — M67962 Unspecified disorder of synovium and tendon, left lower leg: Secondary | ICD-10-CM | POA: Diagnosis not present

## 2020-10-15 ENCOUNTER — Encounter: Payer: Managed Care, Other (non HMO) | Admitting: Orthopaedic Surgery

## 2020-10-22 ENCOUNTER — Encounter: Payer: Self-pay | Admitting: Orthopaedic Surgery

## 2020-10-22 ENCOUNTER — Ambulatory Visit (INDEPENDENT_AMBULATORY_CARE_PROVIDER_SITE_OTHER): Payer: Managed Care, Other (non HMO) | Admitting: Orthopaedic Surgery

## 2020-10-22 DIAGNOSIS — M67969 Unspecified disorder of synovium and tendon, unspecified lower leg: Secondary | ICD-10-CM

## 2020-10-22 DIAGNOSIS — Z8739 Personal history of other diseases of the musculoskeletal system and connective tissue: Secondary | ICD-10-CM

## 2020-10-22 NOTE — Progress Notes (Signed)
Post-Op Visit Note   Patient: Theresa Raymond           Date of Birth: February 02, 1971           MRN: [id number] Visit Date: 10/22/2020 PCP: Janith Lima, MD   Assessment & Plan:  Chief Complaint:  Chief Complaint  Patient presents with  . Left Knee - Routine Post Op   Visit Diagnoses:  1. Tendinopathy of patella   2. History of Osgood-Schlatter disease     Plan:   Tequia is a 2-week status post left patellar tendon debridement and ossicle removal.  She is doing well overall.  Reports some soreness around the incision.  She has been able to walk around the neighborhood without any problems.  She feels there is some tight discomfort when she flexes the knee past 90 degrees.  The surgical incision is healed.  No signs of infection.  No joint effusion.  She has some slight limitation in flexion past 90 degrees due to just generalized swelling.  She does not have any real pain.  Today we remove the Steri-Strips.  At this point she can leave it uncovered and in terms of activity she can steadily increase as tolerated.  She understands that she will likely have sensitivity to that area especially during kneeling for at least a few months.  If she has any problems she is welcome to follow-up otherwise we can see her back as needed.  Follow-Up Instructions: Return if symptoms worsen or fail to improve.   Orders:  No orders of the defined types were placed in this encounter.  No orders of the defined types were placed in this encounter.   Imaging: No results found.  PMFS History: Patient Active Problem List   Diagnosis Date Noted  . Tendinopathy of patella 08/28/2020  . History of Osgood-Schlatter disease 08/28/2020  . Eczema 03/11/2019  . Chronic chest pain 09/18/2018  . HTN (hypertension) 09/18/2018  . Visit for preventive health examination 03/01/2018  . Hypothyroidism 03/01/2018   Past Medical History:  Diagnosis Date  . Anemia   . Chest pain    ETT 03/2019: Ex 13',  no ECG changes  . Chicken pox   . Heart murmur   . Hypertension   . Migraines   . Thyroid disease     Family History  Problem Relation Age of Onset  . Depression Mother   . Diabetes Mother   . Miscarriages / [place] Mother   . Hearing loss Father   . Stroke Father   . Cancer Maternal Grandfather   . Hyperlipidemia Maternal Grandfather   . Lung cancer Maternal Grandfather   . Stroke Paternal Grandmother   . Early death Paternal Grandfather        Polio  . Colon cancer Neg Hx   . Colon polyps Neg Hx   . Esophageal cancer Neg Hx   . Rectal cancer Neg Hx   . Stomach cancer Neg Hx     Past Surgical History:  Procedure Laterality Date  . CERVIX SURGERY  1992  . CLAVICLE SURGERY  2006   Social History   Occupational History  . Not on file  Tobacco Use  . Smoking status: Never Smoker  . Smokeless tobacco: Never Used  Vaping Use  . Vaping Use: Never used  Substance and Sexual Activity  . Alcohol use: Yes    Alcohol/week: 8.0 standard drinks    Types: 1 Cans of beer, 7 Glasses of wine per week  Comment: Has wine daily, occasional beer and liquor  . Drug use: No  . Sexual activity: Yes    Partners: Male

## 2020-12-10 ENCOUNTER — Other Ambulatory Visit: Payer: Self-pay

## 2020-12-10 DIAGNOSIS — E039 Hypothyroidism, unspecified: Secondary | ICD-10-CM

## 2020-12-10 MED ORDER — LEVOTHYROXINE SODIUM 100 MCG PO TABS: 100.0000 ug | ORAL_TABLET | Freq: Every day | ORAL | 1 refills | Status: DC

## 2021-03-02 ENCOUNTER — Other Ambulatory Visit: Payer: Self-pay | Admitting: Cardiovascular Disease

## 2021-03-26 ENCOUNTER — Other Ambulatory Visit: Payer: Self-pay | Admitting: Cardiovascular Disease

## 2021-04-05 ENCOUNTER — Other Ambulatory Visit: Payer: Self-pay | Admitting: Cardiovascular Disease

## 2021-04-23 ENCOUNTER — Other Ambulatory Visit: Payer: Self-pay | Admitting: Cardiovascular Disease

## 2021-04-26 ENCOUNTER — Encounter: Payer: Self-pay | Admitting: Internal Medicine

## 2021-04-26 ENCOUNTER — Other Ambulatory Visit: Payer: Self-pay

## 2021-04-26 ENCOUNTER — Ambulatory Visit (INDEPENDENT_AMBULATORY_CARE_PROVIDER_SITE_OTHER): Payer: Managed Care, Other (non HMO) | Admitting: Internal Medicine

## 2021-04-26 VITALS — BP 142/86 | HR 67 | Temp 97.8°F | Resp 16 | Ht 63.0 in | Wt 144.0 lb

## 2021-04-26 DIAGNOSIS — I361 Nonrheumatic tricuspid (valve) insufficiency: Secondary | ICD-10-CM | POA: Diagnosis not present

## 2021-04-26 DIAGNOSIS — Z0001 Encounter for general adult medical examination with abnormal findings: Secondary | ICD-10-CM | POA: Diagnosis not present

## 2021-04-26 DIAGNOSIS — Z23 Encounter for immunization: Secondary | ICD-10-CM

## 2021-04-26 DIAGNOSIS — Z124 Encounter for screening for malignant neoplasm of cervix: Secondary | ICD-10-CM

## 2021-04-26 DIAGNOSIS — E039 Hypothyroidism, unspecified: Secondary | ICD-10-CM

## 2021-04-26 DIAGNOSIS — I10 Essential (primary) hypertension: Secondary | ICD-10-CM

## 2021-04-26 LAB — URINALYSIS, ROUTINE W REFLEX MICROSCOPIC
Bilirubin Urine: NEGATIVE
Hgb urine dipstick: NEGATIVE
Ketones, ur: NEGATIVE
Leukocytes,Ua: NEGATIVE
Nitrite: NEGATIVE
RBC / HPF: NONE SEEN (ref 0–?)
Specific Gravity, Urine: <=1.005 — AB (ref 1.000–1.030)
Total Protein, Urine: NEGATIVE
Urine Glucose: NEGATIVE
Urobilinogen, UA: 0.2 (ref 0.0–1.0)
pH: 6.5 (ref 5.0–8.0)

## 2021-04-26 LAB — TSH: TSH: 1.27 u[IU]/mL (ref 0.35–5.50)

## 2021-04-26 LAB — BASIC METABOLIC PANEL
BUN: 16 mg/dL (ref 6–23)
CO2: 30 mEq/L (ref 19–32)
Calcium: 9.4 mg/dL (ref 8.4–10.5)
Chloride: 101 mEq/L (ref 96–112)
Creatinine, Ser: 0.78 mg/dL (ref 0.40–1.20)
GFR: 87.13 mL/min (ref >60.00)
Glucose, Bld: 143 mg/dL — ABNORMAL HIGH (ref 70–99)
Potassium: 4.1 mEq/L (ref 3.5–5.1)
Sodium: 138 mEq/L (ref 135–145)

## 2021-04-26 LAB — LIPID PANEL
Cholesterol: 210 mg/dL — ABNORMAL HIGH (ref 0–200)
HDL: 116.3 mg/dL (ref >39.00)
LDL Cholesterol: 85 mg/dL (ref 0–99)
NonHDL: 93.51
Total CHOL/HDL Ratio: 2
Triglycerides: 43 mg/dL (ref 0.0–149.0)
VLDL: 8.6 mg/dL (ref 0.0–40.0)

## 2021-04-26 LAB — CBC WITH DIFFERENTIAL/PLATELET
Basophils Absolute: 0 10*3/uL (ref 0.0–0.1)
Basophils Relative: 1 % (ref 0.0–3.0)
Eosinophils Absolute: 0.1 10*3/uL (ref 0.0–0.7)
Eosinophils Relative: 1.3 % (ref 0.0–5.0)
HCT: 39.3 % (ref 36.0–46.0)
Hemoglobin: 13.4 g/dL (ref 12.0–15.0)
Lymphocytes Relative: 31 % (ref 12.0–46.0)
Lymphs Abs: 1.4 10*3/uL (ref 0.7–4.0)
MCHC: 34.1 g/dL (ref 30.0–36.0)
MCV: 95.1 fl (ref 78.0–100.0)
Monocytes Absolute: 0.4 10*3/uL (ref 0.1–1.0)
Monocytes Relative: 7.7 % (ref 3.0–12.0)
Neutro Abs: 2.7 10*3/uL (ref 1.4–7.7)
Neutrophils Relative %: 59 % (ref 43.0–77.0)
Platelets: 267 10*3/uL (ref 150.0–400.0)
RBC: 4.13 Mil/uL (ref 3.87–5.11)
RDW: 12.9 % (ref 11.5–15.5)
WBC: 4.6 10*3/uL (ref 4.0–10.5)

## 2021-04-26 LAB — HEPATIC FUNCTION PANEL
ALT: 13 U/L (ref 0–35)
AST: 25 U/L (ref 0–37)
Albumin: 4.6 g/dL (ref 3.5–5.2)
Alkaline Phosphatase: 31 U/L — ABNORMAL LOW (ref 39–117)
Bilirubin, Direct: 0.1 mg/dL (ref 0.0–0.3)
Total Bilirubin: 0.6 mg/dL (ref 0.2–1.2)
Total Protein: 7 g/dL (ref 6.0–8.3)

## 2021-04-26 MED ORDER — VALSARTAN 160 MG PO TABS: 160.0000 mg | ORAL_TABLET | Freq: Every day | ORAL | 1 refills | Status: DC

## 2021-04-26 MED ORDER — INDAPAMIDE 1.25 MG PO TABS: 1.2500 mg | ORAL_TABLET | Freq: Every day | ORAL | 1 refills | Status: DC

## 2021-04-26 NOTE — Progress Notes (Signed)
Subjective:  Patient ID: [name], female    DOB: August 27, 1970  Age: 50 y.o. MRN: [id number]  CC: Annual Exam, Hypothyroidism, and Hypertension  This visit occurred during the SARS-CoV-2 public health emergency.  Safety protocols were in place, including screening questions prior to the visit, additional usage of staff PPE, and extensive cleaning of exam room while observing appropriate contact time as indicated for disinfecting solutions.    HPI Brooklyn Alfredo presents for a CPX and to establish.  She has a history of hypertension.  She does not monitor her blood pressure.  She runs/walks about 2 miles a day.  She does not experience chest pain, shortness of breath, diaphoresis, palpitations, edema, dizziness, or lightheadedness.  She was diagnosed with COVID-19 about 2 years ago and subsequently developed palpitations.  An echocardiogram showed mild to moderate tricuspid regurgitation.  History Louanna has a past medical history of Anemia, Chest pain, Chicken pox, Heart murmur, Hypertension, Migraines, and Thyroid disease.   She has a past surgical history that includes Clavicle surgery (2006) and Cervix surgery (1992).   Her family history includes Cancer in her maternal grandfather; Depression in her mother; Diabetes in her mother; Early death in her paternal grandfather; Hearing loss in her father; Hyperlipidemia in her maternal grandfather; Lung cancer in her maternal grandfather; Miscarriages / [place] in her mother; Stroke in her father and paternal grandmother.She reports that she has never smoked. She has never used smokeless tobacco. She reports current alcohol use of about 8.0 standard drinks per week. She reports that she does not use drugs.  Outpatient Medications Prior to Visit  Medication Sig Dispense Refill   Diclofenac Sodium (PENNSAID) 2 % SOLN Place 1 application onto the skin 2 (two) times daily. 112 g 2   levothyroxine (SYNTHROID) 100 MCG tablet Take 1 tablet  (100 mcg total) by mouth daily before breakfast. 90 tablet 1   Multiple Vitamins-Minerals (MULTIVITAMIN ADULT PO) Take 1 tablet by mouth daily.     triamcinolone cream (KENALOG) 0.1 % Apply 1 application topically as needed (for skin irritation). 30 g 1   hydrochlorothiazide (MICROZIDE) 12.5 MG capsule Take 1 capsule (12.5 mg total) by mouth daily. Please make overdue appt for future refills Thank you 3rd and FINAL attempt 15 capsule 0   valsartan (DIOVAN) 160 MG tablet TAKE 1 TABLET BY MOUTH EVERY DAY 30 tablet 11   HYDROcodone-acetaminophen (NORCO) 5-325 MG tablet Take 1 tablet by mouth every 6 (six) hours as needed. 40 tablet 0   nitroGLYCERIN (NITRODUR - DOSED IN MG/24 HR) 0.2 mg/hr patch Apply 1/4 patch daily to tendon for tendonitis. 30 patch 1   ondansetron (ZOFRAN) 4 MG tablet Take 1 tablet (4 mg total) by mouth every 8 (eight) hours as needed for nausea or vomiting. 40 tablet 0   No facility-administered medications prior to visit.    ROS Review of Systems  Constitutional:  Positive for unexpected weight change (wt gain). Negative for chills, diaphoresis and fatigue.  HENT: Negative.    Eyes: Negative.   Respiratory:  Negative for cough, chest tightness, shortness of breath and wheezing.   Cardiovascular:  Negative for chest pain, palpitations and leg swelling.  Gastrointestinal:  Negative for abdominal pain, constipation, diarrhea, nausea and vomiting.  Endocrine: Negative.   Genitourinary: Negative.  Negative for difficulty urinating.  Musculoskeletal:  Positive for arthralgias. Negative for myalgias.  Skin: Negative.   Neurological: Negative.  Negative for dizziness, weakness, light-headedness and headaches.  Hematological:  Negative for adenopathy. Does not bruise/bleed  easily.  Psychiatric/Behavioral: Negative.     Objective:  BP (!) 142/86 (BP Location: Right Arm, Patient Position: Sitting, Cuff Size: Large)   Pulse 67   Temp 97.8 F (36.6 C) (Oral)   Resp 16   Ht 5'  3" (1.6 m)   Wt 144 lb (65.3 kg)   SpO2 97%   BMI 25.51 kg/m   Physical Exam Vitals reviewed.  Constitutional:      Appearance: She is not ill-appearing.  HENT:     Nose: Nose normal.     Mouth/Throat:     Mouth: Mucous membranes are moist.  Eyes:     General: No scleral icterus.    Conjunctiva/sclera: Conjunctivae normal.  Cardiovascular:     Rate and Rhythm: Normal rate and regular rhythm.     Heart sounds: S1 normal and S2 normal. Murmur heard.  Decrescendo systolic murmur is present with a grade of 1/6.    No gallop.     Comments: 1/6 SEM RUSB and LUSB Pulmonary:     Effort: Pulmonary effort is normal.     Breath sounds: No stridor. No wheezing, rhonchi or rales.  Abdominal:     General: Abdomen is flat.     Palpations: There is no mass.     Tenderness: There is no abdominal tenderness. There is no guarding.     Hernia: No hernia is present.  Musculoskeletal:     Cervical back: Neck supple.     Right lower leg: No edema.     Left lower leg: No edema.  Lymphadenopathy:     Cervical: No cervical adenopathy.  Skin:    General: Skin is warm and dry.  Neurological:     General: No focal deficit present.     Mental Status: She is alert and oriented to person, place, and time.  Psychiatric:        Mood and Affect: Mood normal.    Lab Results  Component Value Date   WBC 4.6 04/26/2021   HGB 13.4 04/26/2021   HCT 39.3 04/26/2021   PLT 267.0 04/26/2021   GLUCOSE 143 (H) 04/26/2021   CHOL 210 (H) 04/26/2021   TRIG 43.0 04/26/2021   HDL 116.30 04/26/2021   LDLCALC 85 04/26/2021   ALT 13 04/26/2021   AST 25 04/26/2021   NA 138 04/26/2021   K 4.1 04/26/2021   CL 101 04/26/2021   CREATININE 0.78 04/26/2021   BUN 16 04/26/2021   CO2 30 04/26/2021   TSH 1.27 04/26/2021   HGBA1C 5.5 04/15/2020    IMPRESSIONS     1. The left ventricle has normal systolic function with an ejection  fraction of 60-65%. The cavity size was normal. There is mildly increased  left  ventricular wall thickness. Left ventricular diastolic parameters  were normal. No evidence of left  ventricular regional wall motion abnormalities.   2. The right ventricle has normal systolic function. The cavity was  normal. There is no increase in right ventricular wall thickness.   3. Mild thickening of the mitral valve leaflet.   4. Tricuspid valve regurgitation is mild-moderate.   5. The aortic valve is tricuspid. Mild thickening of the aortic valve.  Mild calcification of the aortic valve.   6. The aortic root is normal in size and structure.    Assessment & Plan:   Karma was seen today for annual exam, hypothyroidism and hypertension.  Diagnoses and all orders for this visit:  Primary hypertension- She has not achieved her blood pressure  goal of 130/80.  Will upgrade to a more potent thiazide diuretic and will continue the current dose of valsartan.  Her labs are negative for secondary causes or endorgan damage. -     valsartan (DIOVAN) 160 MG tablet; Take 1 tablet (160 mg total) by mouth daily. -     indapamide (LOZOL) 1.25 MG tablet; Take 1 tablet (1.25 mg total) by mouth daily. -     CBC with Differential/Platelet; Future -     Basic metabolic panel; Future -     TSH; Future -     Urinalysis, Routine w reflex microscopic; Future -     Hepatic function panel; Future -     Hepatic function panel -     Urinalysis, Routine w reflex microscopic -     TSH -     Basic metabolic panel -     CBC with Differential/Platelet  Hypothyroidism, unspecified type- Her TSH is in the normal range.  She will stay on the current T4 dosage. -     TSH; Future -     TSH  Encounter for general adult medical examination with abnormal findings- Exam completed, labs reviewed, vaccines reviewed and updated, cancer screenings addressed, patient education was given. -     Lipid panel; Future -     HIV Antibody (routine testing w rflx); Future -     HIV Antibody (routine testing w rflx) -      Lipid panel  Nonrheumatic tricuspid valve regurgitation- She is asymptomatic with this.  I recommended that she continue to follow-up with cardiology.  Other orders -     Flu Vaccine QUAD 6+ mos PF IM (Fluarix Quad PF)  I have discontinued Alysabeth Coll's nitroGLYCERIN, HYDROcodone-acetaminophen, ondansetron, and hydrochlorothiazide. I have also changed her valsartan. Additionally, I am having her start on indapamide. Lastly, I am having her maintain [name], triamcinolone cream, Pennsaid, and levothyroxine.  Meds ordered this encounter  Medications   valsartan (DIOVAN) 160 MG tablet    Sig: Take 1 tablet (160 mg total) by mouth daily.    Dispense:  90 tablet    Refill:  1   indapamide (LOZOL) 1.25 MG tablet    Sig: Take 1 tablet (1.25 mg total) by mouth daily.    Dispense:  90 tablet    Refill:  1     Follow-up: Return in about 6 months (around 10/24/2021).  [name], MD

## 2021-04-26 NOTE — Patient Instructions (Signed)

## 2021-04-27 LAB — HIV ANTIBODY (ROUTINE TESTING W REFLEX): HIV 1&2 Ab, 4th Generation: NONREACTIVE

## 2021-04-28 DIAGNOSIS — Z124 Encounter for screening for malignant neoplasm of cervix: Secondary | ICD-10-CM | POA: Insufficient documentation

## 2021-05-28 ENCOUNTER — Other Ambulatory Visit: Payer: Self-pay

## 2021-05-28 DIAGNOSIS — I10 Essential (primary) hypertension: Secondary | ICD-10-CM

## 2021-05-28 MED ORDER — VALSARTAN 160 MG PO TABS: 160.0000 mg | ORAL_TABLET | Freq: Every day | ORAL | 0 refills | Status: DC

## 2021-06-16 ENCOUNTER — Other Ambulatory Visit: Payer: Self-pay | Admitting: Internal Medicine

## 2021-06-16 ENCOUNTER — Encounter: Payer: Self-pay | Admitting: Internal Medicine

## 2021-06-16 DIAGNOSIS — I10 Essential (primary) hypertension: Secondary | ICD-10-CM

## 2021-06-16 DIAGNOSIS — E039 Hypothyroidism, unspecified: Secondary | ICD-10-CM

## 2021-06-16 MED ORDER — VALSARTAN 160 MG PO TABS
160.0000 mg | ORAL_TABLET | Freq: Every day | ORAL | 1 refills | Status: DC
Start: 2021-06-16 — End: 2021-07-20

## 2021-06-16 MED ORDER — LEVOTHYROXINE SODIUM 100 MCG PO TABS: 100.0000 ug | ORAL_TABLET | Freq: Every day | ORAL | 1 refills | Status: DC

## 2021-06-28 ENCOUNTER — Other Ambulatory Visit: Payer: Self-pay

## 2021-06-28 ENCOUNTER — Encounter: Payer: Self-pay | Admitting: Advanced Practice Midwife

## 2021-06-28 ENCOUNTER — Other Ambulatory Visit (HOSPITAL_COMMUNITY)
Admission: RE | Admit: 2021-06-28 | Discharge: 2021-06-28 | Disposition: A | Discharge status: Home / Self Care | Payer: 59 | Source: Ambulatory Visit | Attending: Advanced Practice Midwife | Admitting: Advanced Practice Midwife

## 2021-06-28 ENCOUNTER — Ambulatory Visit (INDEPENDENT_AMBULATORY_CARE_PROVIDER_SITE_OTHER): Payer: 59 | Admitting: Advanced Practice Midwife

## 2021-06-28 VITALS — BP 131/84 | HR 70 | Ht 63.0 in | Wt 138.0 lb

## 2021-06-28 DIAGNOSIS — Z01419 Encounter for gynecological examination (general) (routine) without abnormal findings: Secondary | ICD-10-CM | POA: Diagnosis not present

## 2021-06-28 DIAGNOSIS — Z1239 Encounter for other screening for malignant neoplasm of breast: Secondary | ICD-10-CM

## 2021-06-28 DIAGNOSIS — Z124 Encounter for screening for malignant neoplasm of cervix: Secondary | ICD-10-CM

## 2021-06-28 NOTE — Progress Notes (Signed)
° °  Subjective:     Theresa Raymond is a 51 y.o. female here at [facility] for a routine exam.  Current complaints: none.  Pt has regular monthly menses.  Personal health questionnaire reviewed: yes.  Do you have a primary care provider? yes Do you feel safe at home? yes  [facility] Visit from 04/15/2020 in [facility]  PHQ-2 Total Score 0       Health Maintenance Due  Topic Date Due   COVID-19 Vaccine (4 - Booster for [place] series) 07/12/2020   PAP SMEAR-Modifier  03/06/2021     Risk factors for chronic health problems: Smoking: Alchohol/how much: Pt BMI: Body mass index is 24.45 kg/m.   Gynecologic History Patient's last menstrual period was 06/10/2021. Contraception: vasectomy Last Pap: 2019. Results were: normal Last mammogram: 2021. Results were: normal  Obstetric History OB History  Gravida Para Term Preterm AB Living  2 2 2         SAB IAB Ectopic Multiple Live Births               # Outcome Date GA Lbr Len/2nd Weight Sex Delivery Anes PTL Lv  2 Term           1 Term              The following portions of the patient's history were reviewed and updated as appropriate: allergies, current medications, past family history, past medical history, past social history, past surgical history, and problem list.  Review of Systems Pertinent items noted in HPI and remainder of comprehensive ROS otherwise negative.    Objective:   BP 131/84    Pulse 70    Ht 5\' 3"  (1.6 m)    Wt 138 lb (62.6 kg)    LMP 06/10/2021    BMI 24.45 kg/m  VS reviewed, nursing note reviewed,  Constitutional: well developed, well nourished, no distress HEENT: normocephalic CV: normal rate Pulm/chest wall: normal effort Breast Exam:  exam performed: right breast normal without mass, skin or nipple changes or axillary nodes, left breast normal without mass, skin or nipple changes or axillary nodes Abdomen: soft Neuro: alert and oriented x  3 Skin: warm, dry Psych: affect normal Pelvic exam: Performed: Cervix pink, visually closed, without lesion, scant white creamy discharge, vaginal walls and external genitalia normal Bimanual exam: Cervix 0/long/high, firm, anterior, neg CMT, uterus nontender, nonenlarged, adnexa without tenderness, enlargement, or mass       Assessment/Plan:   1. Well woman exam with routine gynecological exam --No gyn concerns or problems --Exam today wnl  2. Encounter for screening for malignant neoplasm of breast, unspecified screening modality  - MM DIGITAL SCREENING BILATERAL; Future  3. Cervical cancer screening  - Cytology - PAP( [place])        Follow up in: 1  year  or as needed.   Fatima Blank, CNM 10:38 AM

## 2021-07-01 ENCOUNTER — Encounter: Payer: Self-pay | Admitting: Advanced Practice Midwife

## 2021-07-01 DIAGNOSIS — R87612 Low grade squamous intraepithelial lesion on cytologic smear of cervix (LGSIL): Secondary | ICD-10-CM | POA: Insufficient documentation

## 2021-07-01 LAB — CYTOLOGY - PAP
Comment: NEGATIVE
High risk HPV: NEGATIVE

## 2021-07-09 ENCOUNTER — Other Ambulatory Visit: Payer: Self-pay | Admitting: Internal Medicine

## 2021-07-09 ENCOUNTER — Encounter: Payer: Self-pay | Admitting: Internal Medicine

## 2021-07-12 ENCOUNTER — Other Ambulatory Visit: Payer: Self-pay

## 2021-07-12 ENCOUNTER — Other Ambulatory Visit (INDEPENDENT_AMBULATORY_CARE_PROVIDER_SITE_OTHER): Payer: 59

## 2021-07-12 ENCOUNTER — Other Ambulatory Visit: Payer: Self-pay | Admitting: Internal Medicine

## 2021-07-12 DIAGNOSIS — R739 Hyperglycemia, unspecified: Secondary | ICD-10-CM | POA: Diagnosis not present

## 2021-07-12 LAB — BASIC METABOLIC PANEL
BUN: 13 mg/dL (ref 6–23)
CO2: 28 mEq/L (ref 19–32)
Calcium: 9.4 mg/dL (ref 8.4–10.5)
Chloride: 100 mEq/L (ref 96–112)
Creatinine, Ser: 0.67 mg/dL (ref 0.40–1.20)
GFR: 100.12 mL/min (ref >60.00)
Glucose, Bld: 102 mg/dL — ABNORMAL HIGH (ref 70–99)
Potassium: 3.9 mEq/L (ref 3.5–5.1)
Sodium: 138 mEq/L (ref 135–145)

## 2021-07-12 LAB — HEMOGLOBIN A1C: Hgb A1c MFr Bld: 5.3 % (ref 4.6–6.5)

## 2021-07-14 ENCOUNTER — Telehealth: Payer: Self-pay | Admitting: Internal Medicine

## 2021-07-14 NOTE — Telephone Encounter (Signed)
Rep w/ [facility] requesting verbal auth to fill levothyroxine (SYNTHROID) 100 MCG tablet   Phone [phone]    Ref # [id number]

## 2021-07-14 NOTE — Telephone Encounter (Signed)
Verbal given 

## 2021-07-19 ENCOUNTER — Encounter: Payer: Self-pay | Admitting: Internal Medicine

## 2021-07-19 DIAGNOSIS — I10 Essential (primary) hypertension: Secondary | ICD-10-CM

## 2021-07-20 MED ORDER — VALSARTAN 160 MG PO TABS: 160.0000 mg | ORAL_TABLET | Freq: Every day | ORAL | 1 refills | Status: DC

## 2021-07-20 MED ORDER — INDAPAMIDE 1.25 MG PO TABS: 1.2500 mg | ORAL_TABLET | Freq: Every day | ORAL | 1 refills | Status: DC

## 2021-08-05 ENCOUNTER — Telehealth: Payer: 59 | Admitting: Family Medicine

## 2021-08-05 ENCOUNTER — Ambulatory Visit
Admission: EM | Admit: 2021-08-05 | Discharge: 2021-08-05 | Disposition: A | Discharge status: Home / Self Care | Payer: 59 | Attending: Internal Medicine | Admitting: Internal Medicine

## 2021-08-05 ENCOUNTER — Other Ambulatory Visit: Payer: Self-pay

## 2021-08-05 DIAGNOSIS — H6121 Impacted cerumen, right ear: Secondary | ICD-10-CM | POA: Diagnosis not present

## 2021-08-05 DIAGNOSIS — H938X1 Other specified disorders of right ear: Secondary | ICD-10-CM

## 2021-08-05 MED ORDER — CARBAMIDE PEROXIDE 6.5 % OT SOLN: 5.0000 [drp] | Freq: Two times a day (BID) | OTIC | 0 refills | Status: AC

## 2021-08-05 NOTE — Progress Notes (Signed)
[place]   Needs to have ear looked at with scope.  Patient acknowledged agreement and understanding of the plan.

## 2021-08-05 NOTE — Discharge Instructions (Addendum)
Please use medications as prescribed You should feel better in the next day or 2 Return to urgent care if symptoms persist or worsens.

## 2021-08-05 NOTE — ED Triage Notes (Signed)
Pt reports last week her right ear began feeling clogged.

## 2021-08-05 NOTE — ED Provider Notes (Signed)
[facility]    CSN: [id number] Arrival date & time: 08/05/21  1331      History   Chief Complaint Chief Complaint  Patient presents with   ear clogged    HPI Theresa Raymond is a 51 y.o. female comes to urgent care complaining of buzzing sensation and pressure-like sensation in the right ear.  Symptoms have been persistent for the past week.  Patient endorses recent travel to [place].  She went by air.  No problems with the flight.  Patient denies any ringing in the ears, difficulty hearing, spinning sensation, nausea or vomiting.  No runny nose or sneezing coughing. 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 Authorizing Provider  carbamide peroxide (DEBROX) 6.5 % OTIC solution Place 5 drops into the right ear 2 (two) times daily for 3 days. 08/05/21 08/08/21 Yes Rondell Frick, Myrene Galas, MD  Diclofenac Sodium (PENNSAID) 2 % SOLN Place 1 application onto the  skin 2 (two) times daily. 04/17/20   Gregor Hams, MD  indapamide (LOZOL) 1.25 MG tablet Take 1 tablet (1.25 mg total) by mouth daily. 07/20/21   Janith Lima, MD  levothyroxine (SYNTHROID) 100 MCG tablet Take 1 tablet (100 mcg total) by mouth daily before breakfast. 06/16/21   Janith Lima, MD  Multiple Vitamins-Minerals (MULTIVITAMIN ADULT PO) Take 1 tablet by mouth daily.    [provider]  triamcinolone cream (KENALOG) 0.1 % Apply 1 application topically as needed (for skin irritation). 04/15/20   Brunetta Jeans, PA-C  valsartan (DIOVAN) 160 MG tablet Take 1 tablet (160 mg total) by mouth daily. 07/20/21   Janith Lima, MD    Family History Family History  Problem Relation Age of Onset   Depression Mother    Diabetes Mother    Miscarriages / [place] Mother    Hearing loss Father    Stroke Father    Cancer Maternal Grandfather    Hyperlipidemia Maternal Grandfather    Lung cancer Maternal Grandfather    Stroke Paternal Grandmother    Early death Paternal Grandfather  Polio   Colon cancer Neg Hx    Colon polyps Neg Hx    Esophageal cancer Neg Hx    Rectal cancer Neg Hx    Stomach cancer Neg Hx     Social History Social History   Tobacco Use   Smoking status: Never   Smokeless tobacco: Never  Vaping Use   Vaping Use: Never used  Substance Use Topics   Alcohol use: Yes    Alcohol/week: 8.0 standard drinks    Types: 1 Cans of beer, 7 Glasses of wine per week    Comment: Has wine daily, occasional beer and liquor   Drug use: No     Allergies   Other   Review of Systems Review of Systems  Constitutional: Negative.   HENT:  Negative for ear discharge, ear pain, sinus pressure, sneezing and sore throat.   Eyes:  Negative for discharge, redness and itching.  Gastrointestinal:  Negative for abdominal distention and abdominal pain.    Physical Exam Triage Vital Signs ED Triage Vitals  Enc Vitals Group     BP 08/05/21 1428 (!) 150/81      Pulse Rate 08/05/21 1428 (!) 57     Resp 08/05/21 1428 18     Temp 08/05/21 1428 98.3 F (36.8 C)     Temp Source 08/05/21 1428 Oral     SpO2 08/05/21 1428 99 %     Weight --      Height --      Head Circumference --      Peak Flow --      Pain Score 08/05/21 1427 0     Pain Loc --      Pain Edu? --      Excl. in [place]? --    No data found.  Updated Vital Signs BP (!) 150/81 (BP Location: Right Arm)    Pulse (!) 57    Temp 98.3 F (36.8 C) (Oral)    Resp 18    LMP 08/01/2021    SpO2 99%   Visual Acuity Right Eye Distance:   Left Eye Distance:   Bilateral Distance:    Right Eye Near:   Left Eye Near:    Bilateral Near:     Physical Exam Vitals and nursing note reviewed.  Constitutional:      General: She is not in acute distress.    Appearance: Normal appearance. She is not ill-appearing.  HENT:     Right Ear: Tympanic membrane normal. There is impacted cerumen.     Left Ear: Tympanic membrane normal.  Cardiovascular:     Rate and Rhythm: Normal rate and regular rhythm.  Pulmonary:     Effort: Pulmonary effort is normal.     Breath sounds: Normal breath sounds.  Neurological:     Mental Status: She is alert.     UC Treatments / Results  Labs (all labs ordered are listed, but only abnormal results are displayed) Labs Reviewed - No data to display  EKG   Radiology No results found.  Procedures Procedures (including critical care time)  Medications Ordered in UC Medications - No data to display  Initial Impression / Assessment and Plan / UC Course  I have reviewed the triage vital signs and the nursing notes.  Pertinent labs & imaging results that were available during my care of the patient were reviewed by me and considered in my medical decision making (see chart for details).     1.  Impacted cerumen of  the right: Debrox twice daily for 3 days Return precautions given If symptoms worsen please return to urgent care to be reevaluated. Final  Clinical Impressions(s) / UC Diagnoses   Final diagnoses:  Impacted cerumen of right ear     Discharge Instructions      Please use medications as prescribed You should feel better in the next day or 2 Return to urgent care if symptoms persist or worsens.     ED Prescriptions     Medication Sig Dispense Auth. Provider   carbamide peroxide (DEBROX) 6.5 % OTIC solution Place 5 drops into the right ear 2 (two) times daily for 3 days. 15 mL Larisa Lanius, Myrene Galas, MD      PDMP not reviewed this encounter.   Chase Picket, MD 08/05/21 [phone]

## 2021-09-07 ENCOUNTER — Ambulatory Visit
Admission: RE | Admit: 2021-09-07 | Discharge: 2021-09-07 | Disposition: A | Discharge status: Home / Self Care | Payer: 59 | Source: Ambulatory Visit | Attending: Advanced Practice Midwife | Admitting: Advanced Practice Midwife

## 2021-09-07 DIAGNOSIS — Z1239 Encounter for other screening for malignant neoplasm of breast: Secondary | ICD-10-CM

## 2021-12-29 ENCOUNTER — Other Ambulatory Visit: Payer: Self-pay | Admitting: Internal Medicine

## 2021-12-29 DIAGNOSIS — I10 Essential (primary) hypertension: Secondary | ICD-10-CM

## 2022-01-05 ENCOUNTER — Encounter: Payer: Self-pay | Admitting: Family Medicine

## 2022-01-05 ENCOUNTER — Ambulatory Visit: Payer: Commercial Managed Care - PPO | Admitting: Family Medicine

## 2022-01-05 ENCOUNTER — Other Ambulatory Visit: Payer: Self-pay | Admitting: Internal Medicine

## 2022-01-05 VITALS — BP 116/74 | HR 60 | Temp 97.6°F | Ht 63.0 in | Wt 134.0 lb

## 2022-01-05 DIAGNOSIS — I10 Essential (primary) hypertension: Secondary | ICD-10-CM | POA: Diagnosis not present

## 2022-01-05 DIAGNOSIS — E039 Hypothyroidism, unspecified: Secondary | ICD-10-CM | POA: Diagnosis not present

## 2022-01-05 LAB — BASIC METABOLIC PANEL
BUN: 18 mg/dL (ref 6–23)
CO2: 29 mEq/L (ref 19–32)
Calcium: 9.4 mg/dL (ref 8.4–10.5)
Chloride: 100 mEq/L (ref 96–112)
Creatinine, Ser: 0.76 mg/dL (ref 0.40–1.20)
GFR: 89.45 mL/min (ref >60.00)
Glucose, Bld: 108 mg/dL — ABNORMAL HIGH (ref 70–99)
Potassium: 4.2 mEq/L (ref 3.5–5.1)
Sodium: 136 mEq/L (ref 135–145)

## 2022-01-05 LAB — TSH: TSH: 0.64 u[IU]/mL (ref 0.35–5.50)

## 2022-01-05 MED ORDER — LEVOTHYROXINE SODIUM 100 MCG PO TABS: 100.0000 ug | ORAL_TABLET | Freq: Every day | ORAL | 0 refills | Status: DC

## 2022-01-05 MED ORDER — VALSARTAN 160 MG PO TABS: 160.0000 mg | ORAL_TABLET | Freq: Every day | ORAL | 0 refills | Status: DC

## 2022-01-05 MED ORDER — INDAPAMIDE 1.25 MG PO TABS: 1.2500 mg | ORAL_TABLET | Freq: Every day | ORAL | 0 refills | Status: DC

## 2022-01-05 NOTE — Progress Notes (Addendum)
Subjective:     Patient ID: Theresa Raymond, female    DOB: April 23, 1971, 51 y.o.   MRN: [id number]  Chief Complaint  Patient presents with   Medication Refill    Lozol, levothyroxine and valsartan refill. Requests 3 month supply    HPI Patient is in today for medication refill request.   HTN- taking indapamide 1.25 mg and valsartan 160 mg daily without any concerns.  She has an app on her phone with her recent blood pressures which are all in goal range.  Hypothyroidism-taking levothyroxine 100 mcg daily before breakfast.  Denies dizziness, headache, chest pain, palpitations, shortness of breath, abdominal pain, nausea, vomiting or diarrhea.  No LE edema.  There are no preventive care reminders to display for this patient.  Past Medical History:  Diagnosis Date   Anemia    Chest pain    ETT 03/2019: Ex 13', no ECG changes   Chicken pox    Heart murmur    Hypertension    Migraines    Thyroid disease     Past Surgical History:  Procedure Laterality Date   CERVIX SURGERY  1992   CLAVICLE SURGERY  2006    Family History  Problem Relation Age of Onset   Depression Mother    Diabetes Mother    Miscarriages / [place] Mother    Hearing loss Father    Stroke Father    Cancer Maternal Grandfather    Hyperlipidemia Maternal Grandfather    Lung cancer Maternal Grandfather    Stroke Paternal Grandmother    Early death Paternal Grandfather        Polio   Colon cancer Neg Hx    Colon polyps Neg Hx    Esophageal cancer Neg Hx    Rectal cancer Neg Hx    Stomach cancer Neg Hx     Social History   Socioeconomic History   Marital status: Married    Spouse name: Not on file   Number of children: Not on file   Years of education: Not on file   Highest education level: Not on file  Occupational History   Not on file  Tobacco Use   Smoking status: Never   Smokeless tobacco: Never  Vaping Use   Vaping Use: Never used  Substance and Sexual Activity   Alcohol  use: Yes    Alcohol/week: 8.0 standard drinks of alcohol    Types: 1 Cans of beer, 7 Glasses of wine per week    Comment: Has wine daily, occasional beer and liquor   Drug use: No   Sexual activity: Yes    Partners: Male  Other Topics Concern   Not on file  Social History Narrative   Not on file   Social Determinants of Health   Financial Resource Strain: Not on file  Food Insecurity: Not on file  Transportation Needs: Not on file  Physical Activity: Not on file  Stress: Not on file  Social Connections: Not on file  Intimate Partner Violence: Not on file    Outpatient Medications Prior to Visit  Medication Sig Dispense Refill   Diclofenac Sodium (PENNSAID) 2 % SOLN Place 1 application onto the skin 2 (two) times daily. 112 g 2   Multiple Vitamins-Minerals (MULTIVITAMIN ADULT PO) Take 1 tablet by mouth daily.     triamcinolone cream (KENALOG) 0.1 % Apply 1 application topically as needed (for skin irritation). 30 g 1   indapamide (LOZOL) 1.25 MG tablet Take 1 tablet (1.25 mg total) by  mouth daily. 90 tablet 1   levothyroxine (SYNTHROID) 100 MCG tablet Take 1 tablet (100 mcg total) by mouth daily before breakfast. 90 tablet 1   valsartan (DIOVAN) 160 MG tablet Take 1 tablet (160 mg total) by mouth daily. 90 tablet 1   No facility-administered medications prior to visit.    Allergies  Allergen Reactions   Other Swelling and Other (See Comments)    TREE NUTS make patient's throat "tight," but her breathing doesn't become impaired (I asked)    ROS     Objective:    Physical Exam  BP 116/74 (BP Location: Left Arm, Patient Position: Sitting, Cuff Size: Large)   Pulse 60   Temp 97.6 F (36.4 C) (Temporal)   Ht '5\' 3"'$  (1.6 m)   Wt 134 lb (60.8 kg)   SpO2 99%   BMI 23.74 kg/m  Wt Readings from Last 3 Encounters:  01/05/22 134 lb (60.8 kg)  06/28/21 138 lb (62.6 kg)  04/26/21 144 lb (65.3 kg)   Alert and in no distress. Cardiac exam shows a regular rate and rhythm.  Lungs are clear to auscultation.  No LE edema.  Normal speech and mood.      Assessment & Plan:   Problem List Items Addressed This Visit       Cardiovascular and Mediastinum   HTN (hypertension)    Blood pressures well controlled on current medications.  Continue indapamide 1.25 mg and valsartan 160 mg daily.  BMP ordered.  I will refill her medication until she has her follow-up with her PCP in November.      Relevant Medications   indapamide (LOZOL) 1.25 MG tablet   valsartan (DIOVAN) 160 MG tablet   Other Relevant Orders   Basic metabolic panel (Completed)     Endocrine   Hypothyroidism - Primary    Continue levothyroxine 100 mcg daily.  Check TSH and adjust dose if appropriate or refill until November until she sees her PCP.      Relevant Medications   levothyroxine (SYNTHROID) 100 MCG tablet   Other Relevant Orders   TSH (Completed)   Reviewed labs and refilled medication until she has f/u with her PCP.  I am having Theresa Raymond maintain [name], triamcinolone cream, Pennsaid, indapamide, valsartan, and levothyroxine.  Meds ordered this encounter  Medications   indapamide (LOZOL) 1.25 MG tablet    Sig: Take 1 tablet (1.25 mg total) by mouth daily.    Dispense:  90 tablet    Refill:  0    Order Specific Question:   Supervising Provider    Answer:   Pricilla Holm A [[id number]]   valsartan (DIOVAN) 160 MG tablet    Sig: Take 1 tablet (160 mg total) by mouth daily.    Dispense:  90 tablet    Refill:  0    Order Specific Question:   Supervising Provider    Answer:   Pricilla Holm A [[id number]]   levothyroxine (SYNTHROID) 100 MCG tablet    Sig: Take 1 tablet (100 mcg total) by mouth daily before breakfast.    Dispense:  90 tablet    Refill:  0    Order Specific Question:   Supervising Provider    Answer:   Pricilla Holm A [[id number]]

## 2022-01-05 NOTE — Addendum Note (Signed)
Addended by: Girtha Rm on: 01/05/2022 02:41 PM   Modules accepted: Orders

## 2022-01-05 NOTE — Assessment & Plan Note (Signed)
Blood pressures well controlled on current medications.  Continue indapamide 1.25 mg and valsartan 160 mg daily.  BMP ordered.  I will refill her medication until she has her follow-up with her PCP in November.

## 2022-01-05 NOTE — Patient Instructions (Addendum)
Please go to the lab on the first floor before you leave today.   I will refill the requested medications later today or in the morning once your results are in.   Continue taking good care of yourself.   Follow up with Dr. Ronnald Ramp in November.

## 2022-01-05 NOTE — Assessment & Plan Note (Signed)
Continue levothyroxine 100 mcg daily.  Check TSH and adjust dose if appropriate or refill until November until she sees her PCP.

## 2022-01-20 ENCOUNTER — Ambulatory Visit: Payer: 59 | Admitting: Internal Medicine

## 2022-04-05 ENCOUNTER — Other Ambulatory Visit: Payer: Self-pay | Admitting: Family Medicine

## 2022-04-05 DIAGNOSIS — I10 Essential (primary) hypertension: Secondary | ICD-10-CM

## 2022-04-05 DIAGNOSIS — E039 Hypothyroidism, unspecified: Secondary | ICD-10-CM

## 2022-04-16 IMAGING — MG MM DIGITAL SCREENING BILAT W/ TOMO AND CAD
8 series · 9 of 24 positions shown · non-contrast
Comparison: Previous exam(s).

CLINICAL DATA: Screening.

EXAM:
DIGITAL SCREENING BILATERAL MAMMOGRAM WITH TOMOSYNTHESIS AND CAD
TECHNIQUE: Bilateral screening digital craniocaudal and mediolateral oblique
mammograms were obtained. Bilateral screening digital breast
tomosynthesis was performed. The images were evaluated with
computer-aided detection.

[R CC synth-2D]
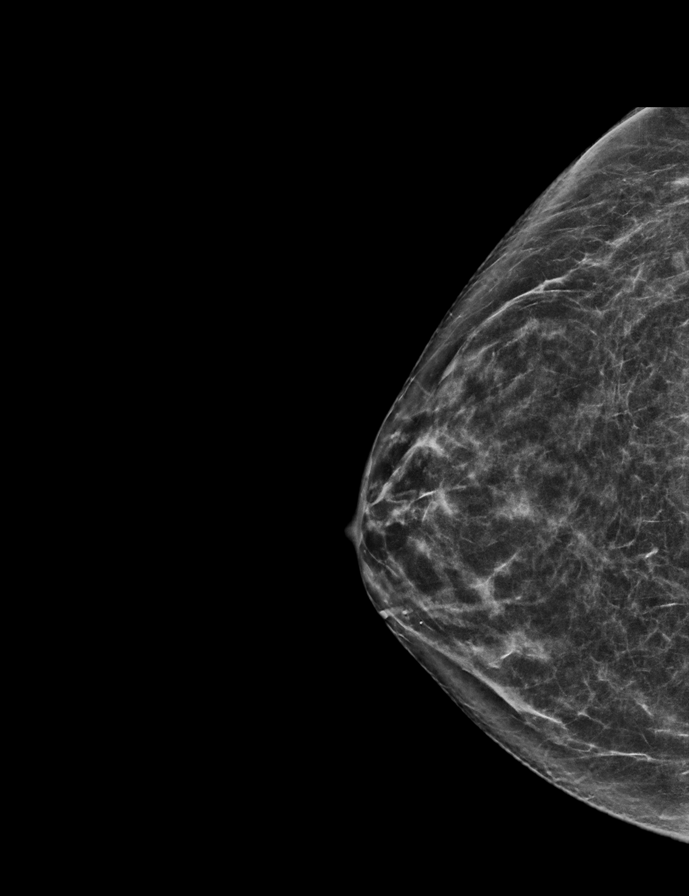

[R MLO synth-2D]
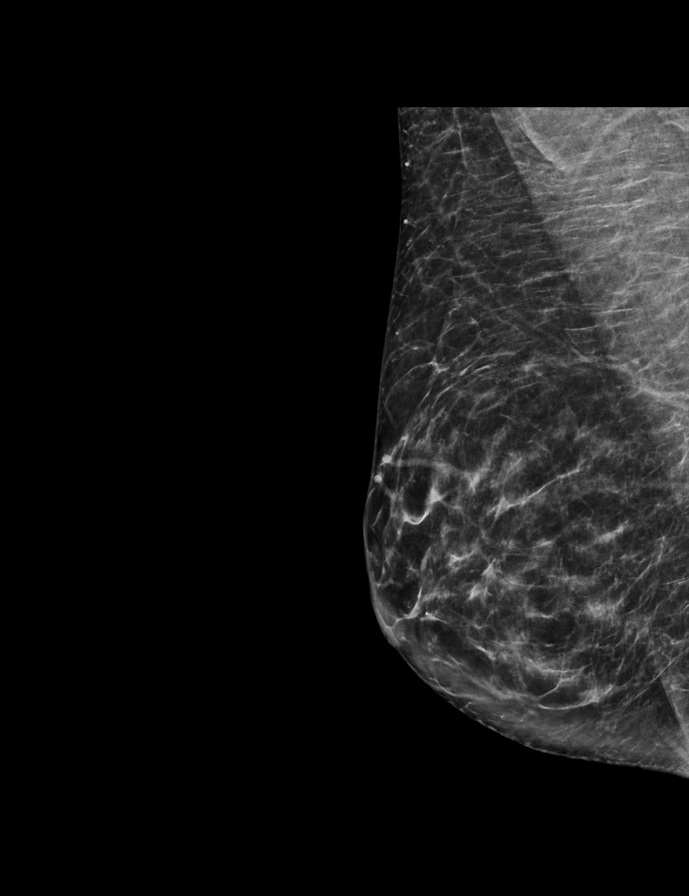

[L MLO synth-2D]
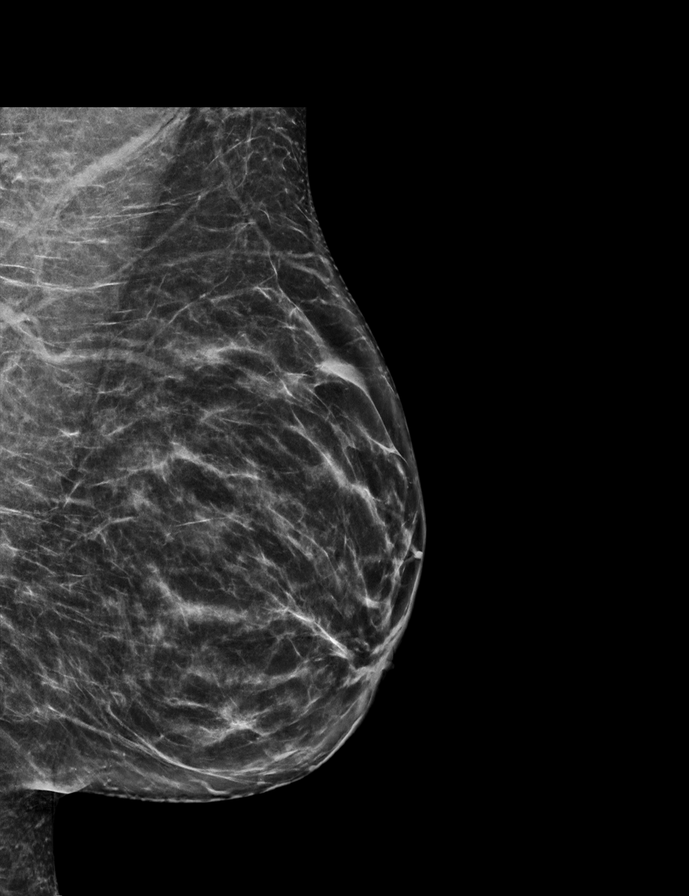

[L CC synth-2D]
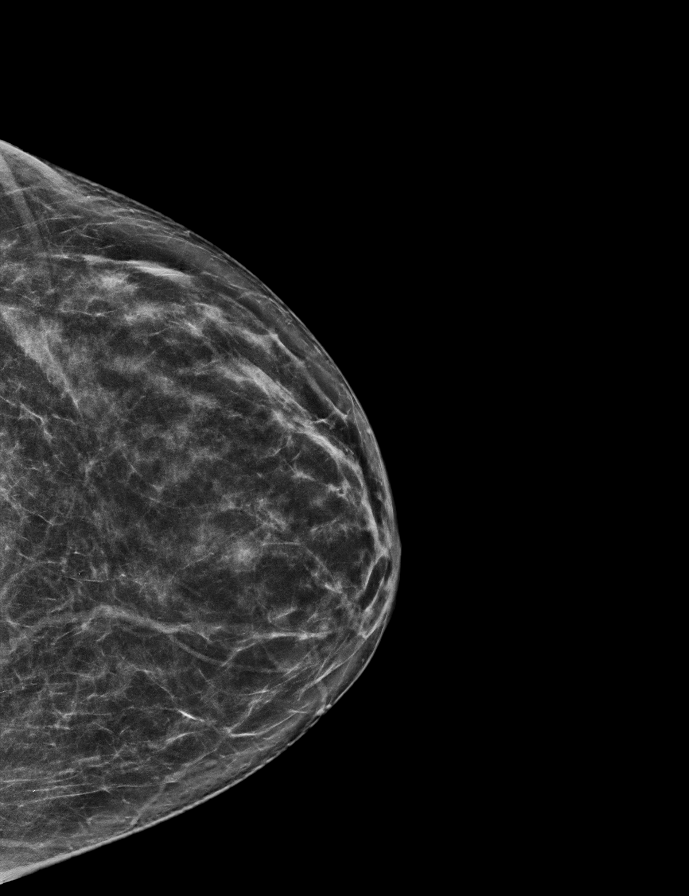

[L MLO tomo · 2 of 68 frames shown]
[frame 22/68]
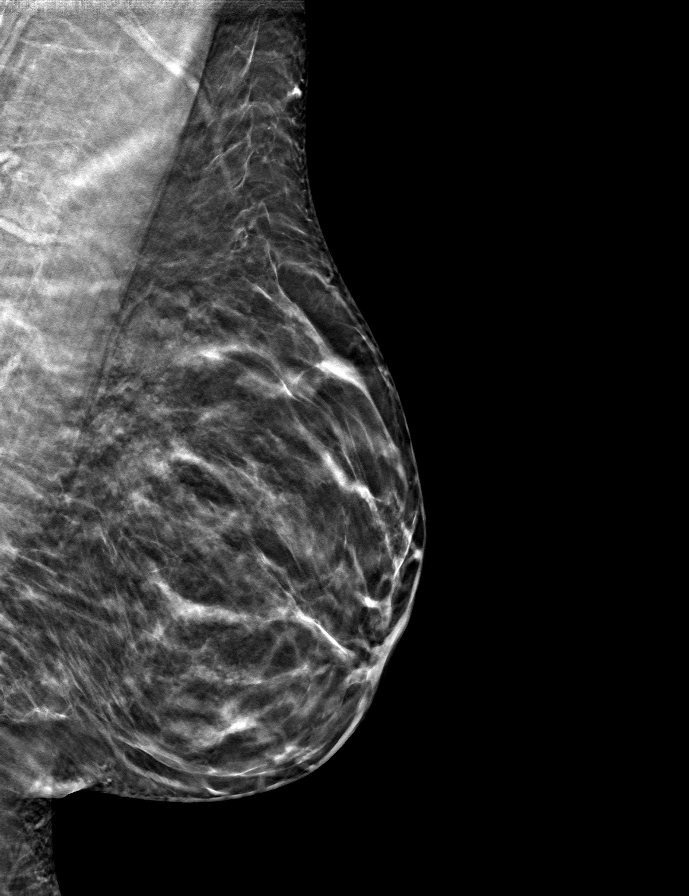
[frame 35/68]
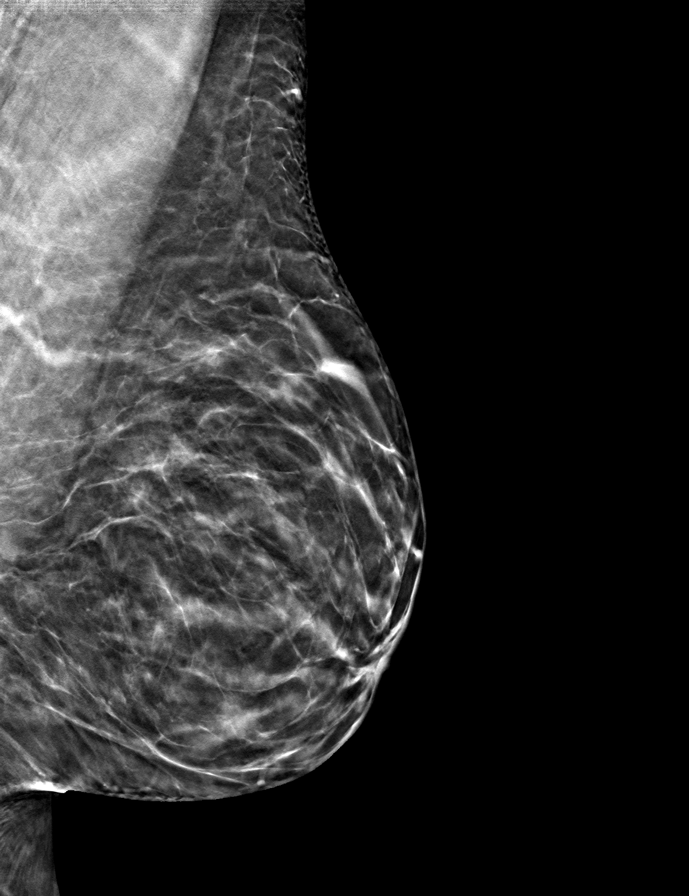

[R CC tomo · tomo slice 32/63.0]
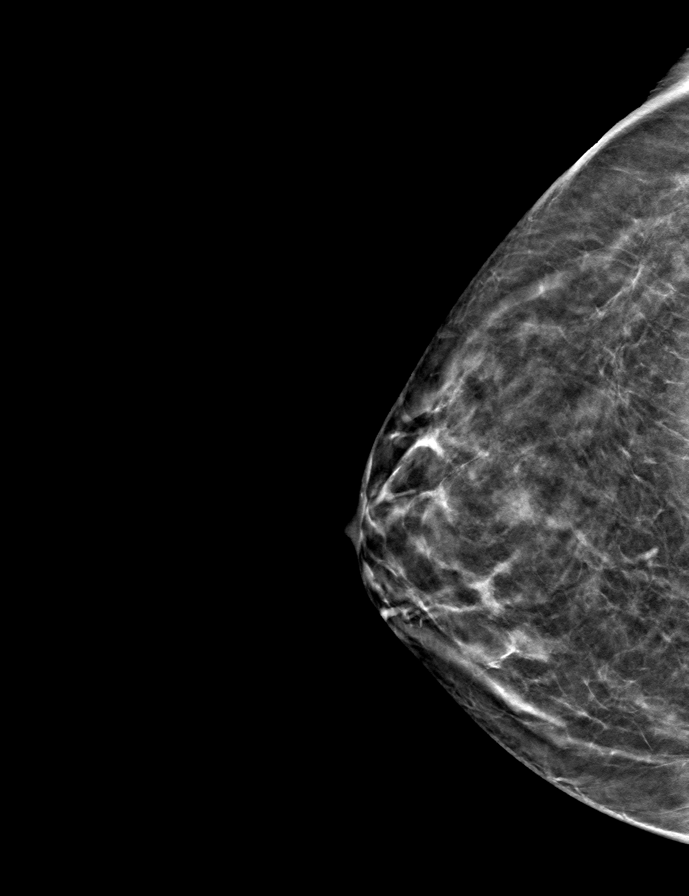

[L CC tomo · tomo slice 33/66.0]
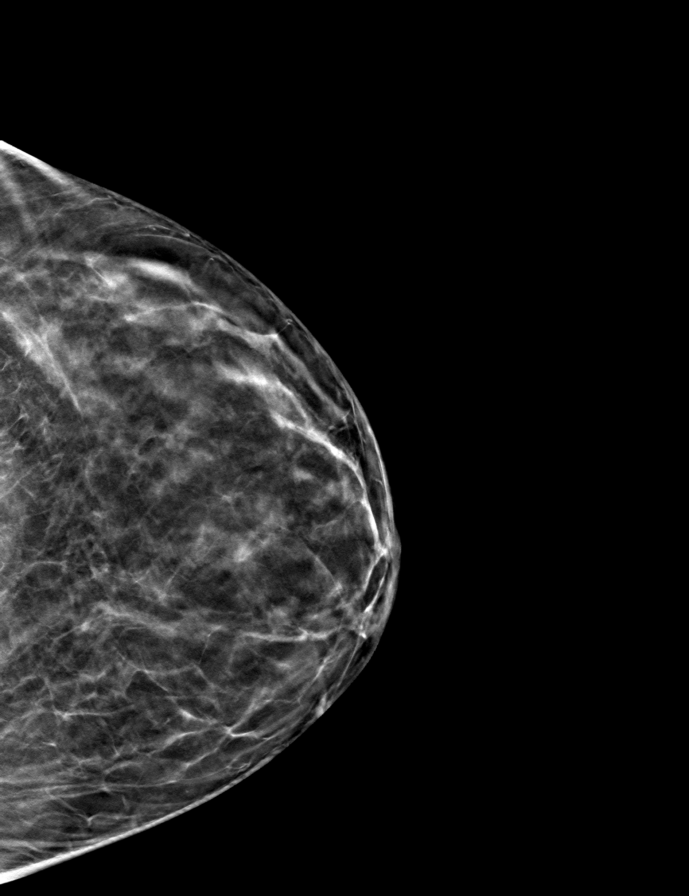

[R MLO tomo · tomo slice 33/66.0]
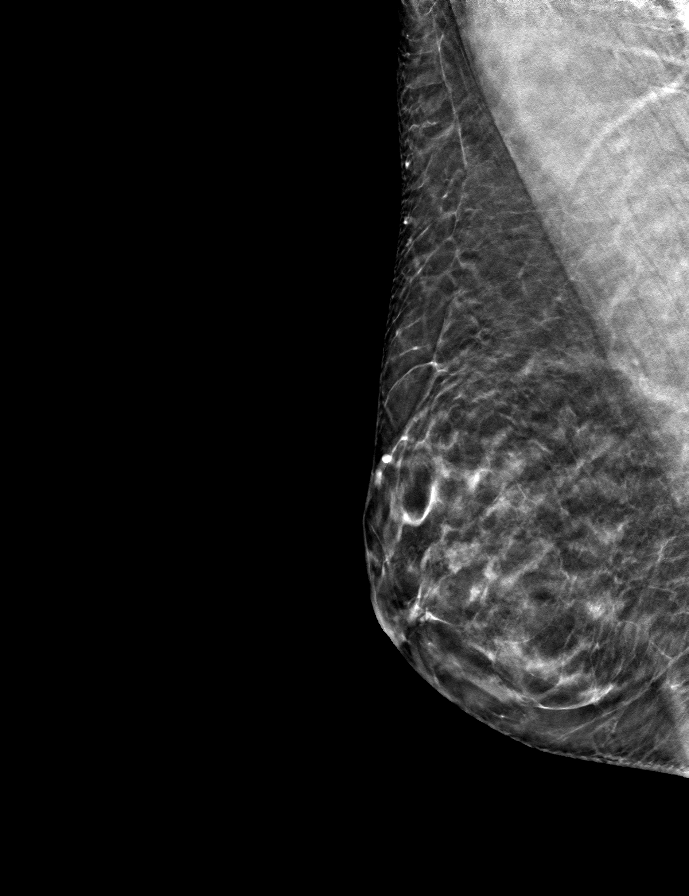

[9 of 24 positions shown; findings below may reference images not displayed]

ACR Breast Density Category b: There are scattered areas of
fibroglandular density.
FINDINGS: There are no findings suspicious for malignancy.
IMPRESSION: No mammographic evidence of malignancy. A result letter of this
screening mammogram will be mailed directly to the patient.

RECOMMENDATION:
Screening mammogram in one year. (Code:51-O-LD2)

BI-RADS CATEGORY  1: Negative.

## 2022-04-28 ENCOUNTER — Encounter: Payer: Self-pay | Admitting: Internal Medicine

## 2022-04-28 ENCOUNTER — Ambulatory Visit (INDEPENDENT_AMBULATORY_CARE_PROVIDER_SITE_OTHER): Payer: Commercial Managed Care - PPO | Admitting: Internal Medicine

## 2022-04-28 VITALS — BP 142/80 | HR 60 | Temp 98.1°F | Resp 16 | Ht 63.0 in | Wt 134.0 lb

## 2022-04-28 DIAGNOSIS — R002 Palpitations: Secondary | ICD-10-CM

## 2022-04-28 DIAGNOSIS — R001 Bradycardia, unspecified: Secondary | ICD-10-CM

## 2022-04-28 DIAGNOSIS — I10 Essential (primary) hypertension: Secondary | ICD-10-CM | POA: Diagnosis not present

## 2022-04-28 DIAGNOSIS — E039 Hypothyroidism, unspecified: Secondary | ICD-10-CM | POA: Diagnosis not present

## 2022-04-28 DIAGNOSIS — Z23 Encounter for immunization: Secondary | ICD-10-CM

## 2022-04-28 DIAGNOSIS — Z91013 Allergy to seafood: Secondary | ICD-10-CM

## 2022-04-28 LAB — CBC WITH DIFFERENTIAL/PLATELET
Basophils Absolute: 0.1 10*3/uL (ref 0.0–0.1)
Basophils Relative: 0.9 % (ref 0.0–3.0)
Eosinophils Absolute: 0.1 10*3/uL (ref 0.0–0.7)
Eosinophils Relative: 1.3 % (ref 0.0–5.0)
HCT: 38.3 % (ref 36.0–46.0)
Hemoglobin: 13 g/dL (ref 12.0–15.0)
Lymphocytes Relative: 29.9 % (ref 12.0–46.0)
Lymphs Abs: 1.7 10*3/uL (ref 0.7–4.0)
MCHC: 34 g/dL (ref 30.0–36.0)
MCV: 96.1 fl (ref 78.0–100.0)
Monocytes Absolute: 0.5 10*3/uL (ref 0.1–1.0)
Monocytes Relative: 9 % (ref 3.0–12.0)
Neutro Abs: 3.2 10*3/uL (ref 1.4–7.7)
Neutrophils Relative %: 58.9 % (ref 43.0–77.0)
Platelets: 279 10*3/uL (ref 150.0–400.0)
RBC: 3.98 Mil/uL (ref 3.87–5.11)
RDW: 13.7 % (ref 11.5–15.5)
WBC: 5.5 10*3/uL (ref 4.0–10.5)

## 2022-04-28 LAB — BASIC METABOLIC PANEL
BUN: 13 mg/dL (ref 6–23)
CO2: 30 mEq/L (ref 19–32)
Calcium: 9.6 mg/dL (ref 8.4–10.5)
Chloride: 97 mEq/L (ref 96–112)
Creatinine, Ser: 0.7 mg/dL (ref 0.40–1.20)
GFR: 98.52 mL/min (ref >60.00)
Glucose, Bld: 107 mg/dL — ABNORMAL HIGH (ref 70–99)
Potassium: 3.6 mEq/L (ref 3.5–5.1)
Sodium: 134 mEq/L — ABNORMAL LOW (ref 135–145)

## 2022-04-28 LAB — TSH: TSH: 1.59 u[IU]/mL (ref 0.35–5.50)

## 2022-04-28 MED ORDER — EPINEPHRINE 0.3 MG/0.3ML IJ SOAJ: 0.3000 mg | INTRAMUSCULAR | 3 refills | Status: AC | PRN

## 2022-04-28 NOTE — Progress Notes (Signed)
Subjective:  Patient ID: Theresa Raymond, female    DOB: 10/19/1970  Age: 51 y.o. MRN: [id number]  CC: Hypertension and Hypothyroidism   HPI Theresa Raymond presents for f/up -  She complains of a several month history of palpitations that she describes as an intermittent fluttering sensation.  She runs half marathons and the palpitations do not interfere with her activities.  She denies dizziness, lightheadedness, near-syncope, or syncope.  Outpatient Medications Prior to Visit  Medication Sig Dispense Refill   indapamide (LOZOL) 1.25 MG tablet TAKE 1 TABLET DAILY 90 tablet 0   levothyroxine (SYNTHROID) 100 MCG tablet TAKE 1 TABLET DAILY BEFORE BREAKFAST 90 tablet 0   Multiple Vitamins-Minerals (MULTIVITAMIN ADULT PO) Take 1 tablet by mouth daily.     valsartan (DIOVAN) 160 MG tablet TAKE 1 TABLET DAILY 90 tablet 0   Diclofenac Sodium (PENNSAID) 2 % SOLN Place 1 application onto the skin 2 (two) times daily. 112 g 2   triamcinolone cream (KENALOG) 0.1 % Apply 1 application topically as needed (for skin irritation). 30 g 1   No facility-administered medications prior to visit.    ROS Review of Systems  Constitutional:  Negative for diaphoresis and fatigue.  HENT: Negative.    Eyes: Negative.   Respiratory:  Negative for cough, chest tightness and shortness of breath.   Cardiovascular:  Positive for palpitations. Negative for chest pain and leg swelling.  Gastrointestinal:  Negative for abdominal pain, diarrhea, nausea and vomiting.  Genitourinary: Negative.  Negative for difficulty urinating.  Musculoskeletal: Negative.   Skin: Negative.   Neurological: Negative.  Negative for dizziness, syncope, weakness and light-headedness.  Hematological:  Negative for adenopathy. Does not bruise/bleed easily.  Psychiatric/Behavioral: Negative.      Objective:  BP (!) 142/80 (BP Location: Left Arm, Patient Position: Sitting, Cuff Size: Large)   Pulse 60   Temp 98.1 F (36.7 C) (Oral)    Resp 16   Ht '5\' 3"'$  (1.6 m)   Wt 134 lb (60.8 kg)   SpO2 96%   BMI 23.74 kg/m   BP Readings from Last 3 Encounters:  04/28/22 (!) 142/80  01/05/22 116/74  08/05/21 (!) 150/81    Wt Readings from Last 3 Encounters:  04/28/22 134 lb (60.8 kg)  01/05/22 134 lb (60.8 kg)  06/28/21 138 lb (62.6 kg)    Physical Exam Vitals reviewed.  HENT:     Mouth/Throat:     Mouth: Mucous membranes are moist.  Eyes:     General: No scleral icterus.    Conjunctiva/sclera: Conjunctivae normal.  Cardiovascular:     Rate and Rhythm: Regular rhythm. Bradycardia present.     Heart sounds: Murmur heard.     Systolic murmur is present with a grade of 1/6.     No diastolic murmur is present.     No gallop.     Comments: EKG- SB, 53 bpm LAD TWI in V1 is new No Q waves Pulmonary:     Effort: Pulmonary effort is normal.     Breath sounds: No stridor. No wheezing, rhonchi or rales.  Abdominal:     General: Abdomen is flat.     Palpations: There is no mass.     Tenderness: There is no abdominal tenderness. There is no guarding.     Hernia: No hernia is present.  Musculoskeletal:     Cervical back: Neck supple.     Right lower leg: No edema.     Left lower leg: No edema.  Skin:  General: Skin is warm and dry.  Neurological:     General: No focal deficit present.     Mental Status: She is alert.  Psychiatric:        Mood and Affect: Mood normal.        Behavior: Behavior normal.     Lab Results  Component Value Date   WBC 5.5 04/28/2022   HGB 13.0 04/28/2022   HCT 38.3 04/28/2022   PLT 279.0 04/28/2022   GLUCOSE 107 (H) 04/28/2022   CHOL 210 (H) 04/26/2021   TRIG 43.0 04/26/2021   HDL 116.30 04/26/2021   LDLCALC 85 04/26/2021   ALT 13 04/26/2021   AST 25 04/26/2021   NA 134 (L) 04/28/2022   K 3.6 04/28/2022   CL 97 04/28/2022   CREATININE 0.70 04/28/2022   BUN 13 04/28/2022   CO2 30 04/28/2022   TSH 1.59 04/28/2022   HGBA1C 5.3 07/12/2021    MM 3D SCREEN BREAST  BILATERAL  Result Date: 09/08/2021 CLINICAL DATA:  Screening. EXAM: DIGITAL SCREENING BILATERAL MAMMOGRAM WITH TOMOSYNTHESIS AND CAD TECHNIQUE: Bilateral screening digital craniocaudal and mediolateral oblique mammograms were obtained. Bilateral screening digital breast tomosynthesis was performed. The images were evaluated with computer-aided detection. COMPARISON:  Previous exam(s). ACR Breast Density Category b: There are scattered areas of fibroglandular density. FINDINGS: There are no findings suspicious for malignancy. IMPRESSION: No mammographic evidence of malignancy. A result letter of this screening mammogram will be mailed directly to the patient. RECOMMENDATION: Screening mammogram in one year. (Code:SM-B-01Y) BI-RADS CATEGORY  1: Negative. Electronically Signed   By: Lillia Mountain M.D.   On: 09/08/2021 08:22    Assessment & Plan:   Theresa Raymond was seen today for hypertension and hypothyroidism.  Diagnoses and all orders for this visit:  Primary hypertension- Her blood pressure is adequately well controlled. -     Basic metabolic panel; Future -     CBC with Differential/Platelet; Future -     CBC with Differential/Platelet -     Basic metabolic panel  Acquired hypothyroidism- She is euthyroid. -     TSH; Future -     TSH  Intermittent palpitations- Will evaluate for tdysrhythmias. -     EKG 12-Lead -     CARDIAC EVENT MONITOR; Future  Bradycardia, sinus -     CARDIAC EVENT MONITOR; Future  Shellfish allergy -     EPINEPHrine (EPIPEN 2-PAK) 0.3 mg/0.3 mL IJ SOAJ injection; Inject 0.3 mg into the muscle as needed for anaphylaxis.  Other orders -     Flu Vaccine QUAD 6+ mos PF IM (Fluarix Quad PF)   I have discontinued Theresa Raymond's triamcinolone cream and Pennsaid. I am also having her start on EPINEPHrine. Additionally, I am having her maintain [name], levothyroxine, valsartan, and indapamide.  Meds ordered this encounter   Medications   EPINEPHrine (EPIPEN 2-PAK) 0.3 mg/0.3 mL IJ SOAJ injection    Sig: Inject 0.3 mg into the muscle as needed for anaphylaxis.    Dispense:  2 each    Refill:  3     Follow-up: Return in about 6 months (around 10/27/2022).  [name], MD

## 2022-04-28 NOTE — Patient Instructions (Signed)
Palpitations Palpitations are feelings that your heartbeat is irregular or is faster than normal. It may feel like your heart is fluttering or skipping a beat. Palpitations may be caused by many things, including smoking, caffeine, alcohol, stress, and certain medicines or drugs. Most causes of palpitations are not serious.  However, some palpitations can be a sign of a serious problem. Further tests and a thorough medical history will be done to find the cause of your palpitations. Your provider may order tests such as an ECG, labs, an echocardiogram, or an ambulatory continuous ECG monitor. Follow these instructions at home: Pay attention to any changes in your symptoms. Let your health care provider know about them. Take these actions to help manage your symptoms: Eating and drinking Follow instructions from your health care provider about eating or drinking restrictions. You may need to avoid foods and drinks that may cause palpitations. These may include: Caffeinated coffee, tea, soft drinks, and energy drinks. Chocolate. Alcohol. Diet pills. Lifestyle     Take steps to reduce your stress and anxiety. Things that can help you relax include: Yoga. Mind-body activities, such as deep breathing, meditation, or using words and images to create positive thoughts (guided imagery). Physical activity, such as swimming, jogging, or walking. Tell your health care provider if your palpitations increase with activity. If you have chest pain or shortness of breath with activity, do not continue the activity until you are seen by your health care provider. Biofeedback. This is a method that helps you learn to use your mind to control things in your body, such as your heartbeat. Get plenty of rest and sleep. Keep a regular bed time. Do not use drugs, including cocaine or ecstasy. Do not use marijuana. Do not use any products that contain nicotine or tobacco. These products include cigarettes, chewing  tobacco, and vaping devices, such as e-cigarettes. If you need help quitting, ask your health care provider. General instructions Take over-the-counter and prescription medicines only as told by your health care provider. Keep all follow-up visits. This is important. These may include visits for further testing if palpitations do not go away or get worse. Contact a health care provider if: You continue to have a fast or irregular heartbeat for a long period of time. You notice that your palpitations occur more often. Get help right away if: You have chest pain or shortness of breath. You have a severe headache. You feel dizzy or you faint. These symptoms may represent a serious problem that is an emergency. Do not wait to see if the symptoms will go away. Get medical help right away. Call your local emergency services (911 in the [place] Do not drive yourself to the hospital. Summary Palpitations are feelings that your heartbeat is irregular or is faster than normal. It may feel like your heart is fluttering or skipping a beat. Palpitations may be caused by many things, including smoking, caffeine, alcohol, stress, certain medicines, and drugs. Further tests and a thorough medical history may be done to find the cause of your palpitations. Get help right away if you faint or have chest pain, shortness of breath, severe headache, or dizziness. This information is not intended to replace advice given to you by your health care provider. Make sure you discuss any questions you have with your health care provider. Document Revised: 10/28/2020 Document Reviewed: 10/28/2020 Elsevier Patient Education  2023 Elsevier Inc.  

## 2022-05-07 ENCOUNTER — Ambulatory Visit: Payer: Commercial Managed Care - PPO | Attending: Internal Medicine

## 2022-05-07 DIAGNOSIS — R001 Bradycardia, unspecified: Secondary | ICD-10-CM

## 2022-05-07 DIAGNOSIS — R002 Palpitations: Secondary | ICD-10-CM

## 2022-07-04 ENCOUNTER — Other Ambulatory Visit: Payer: Self-pay | Admitting: Internal Medicine

## 2022-07-04 DIAGNOSIS — I10 Essential (primary) hypertension: Secondary | ICD-10-CM

## 2022-07-04 DIAGNOSIS — E039 Hypothyroidism, unspecified: Secondary | ICD-10-CM

## 2022-08-16 ENCOUNTER — Other Ambulatory Visit (HOSPITAL_COMMUNITY)
Admission: RE | Admit: 2022-08-16 | Discharge: 2022-08-16 | Disposition: A | Discharge status: Home / Self Care | Payer: Commercial Managed Care - PPO | Source: Ambulatory Visit | Attending: Advanced Practice Midwife | Admitting: Advanced Practice Midwife

## 2022-08-16 ENCOUNTER — Encounter: Payer: Self-pay | Admitting: Advanced Practice Midwife

## 2022-08-16 ENCOUNTER — Ambulatory Visit (INDEPENDENT_AMBULATORY_CARE_PROVIDER_SITE_OTHER): Payer: Commercial Managed Care - PPO | Admitting: Advanced Practice Midwife

## 2022-08-16 VITALS — BP 111/71 | HR 70 | Ht 63.0 in | Wt 136.2 lb

## 2022-08-16 DIAGNOSIS — N939 Abnormal uterine and vaginal bleeding, unspecified: Secondary | ICD-10-CM

## 2022-08-16 DIAGNOSIS — Z01419 Encounter for gynecological examination (general) (routine) without abnormal findings: Secondary | ICD-10-CM

## 2022-08-16 DIAGNOSIS — N841 Polyp of cervix uteri: Secondary | ICD-10-CM

## 2022-08-16 DIAGNOSIS — R87612 Low grade squamous intraepithelial lesion on cytologic smear of cervix (LGSIL): Secondary | ICD-10-CM

## 2022-08-16 DIAGNOSIS — R102 Pelvic and perineal pain: Secondary | ICD-10-CM | POA: Diagnosis not present

## 2022-08-16 DIAGNOSIS — Z1231 Encounter for screening mammogram for malignant neoplasm of breast: Secondary | ICD-10-CM

## 2022-08-16 NOTE — Progress Notes (Signed)
Subjective:     Theresa Raymond is a 52 y.o. female here at [facility] for a routine exam.  Current complaints: spotting in between regular periods and pelvic cramping x 2 months.  Pt had LSIL with negative hpv on Pap in 2023.  Pt is a runner and notices changes to her period when running/training for longer races like a half marathon but otherwise periods are monthly with 2 heavy days then a few light days. History of LEEP ~ 30 yrs ago with normal follow up and thickened endometrium with D&C x 1 ~ 10 years ago.  Personal health questionnaire reviewed: yes.  Do you have a primary care provider? yes Do you feel safe at home? yes  [facility] Visit from 08/16/2022 in [facility] for [place] at [facility] Total Score 0       Health Maintenance Due  Topic Date Due   COVID-19 Vaccine (6 - 2023-24 season) 02/18/2022     Risk factors for chronic health problems: Smoking: never Alchohol/how much: daily wine, 8 drinks/ week Pt BMI: Body mass index is 24.13 kg/m.   Gynecologic History Patient's last menstrual period was 08/05/2022 (exact date). Contraception: none Last Pap: 06/28/21. Results were: LSIL with negative hpv Last mammogram: 09/07/21. Results were: normal  Obstetric History OB History  Gravida Para Term Preterm AB Living  '2 2 2     2  '$ SAB IAB Ectopic Multiple Live Births          2    # Outcome Date GA Lbr Len/2nd Weight Sex Delivery Anes PTL Lv  2 Term           1 Term              The following portions of the patient's history were reviewed and updated as appropriate: allergies, current medications, past family history, past medical history, past social history, past surgical history, and problem list.  Review of Systems A comprehensive review of systems was negative.    Objective:  BP 111/71   Pulse 70   Ht '5\' 3"'$  (1.6 m)   Wt 136 lb 3.2 oz (61.8 kg)   LMP 08/05/2022 (Exact Date)   BMI 24.13 kg/m    VS reviewed, nursing note  reviewed,  Constitutional: well developed, well nourished, no distress HEENT: normocephalic, thyroid without enlargement or mass HEART: RRR, no murmurs rubs/gallops RESP: clear and equal to auscultation bilaterally in all lobes  Breast Exam:  exam performed: right breast normal with smooth mobile, ropelike masses in lower outer quadrant, no skin or nipple changes or axillary nodes, left breast normal with smooth mobile, ropelike masses in lower outer quadrant, no skin or nipple changes or axillary nodes Abdomen: soft Neuro: alert and oriented x 3 Skin: warm, dry Psych: affect normal Pelvic exam: Performed: Cervix pink, visually closed, pea sized dark pink smooth mass protruding from cervical os, friable on exam, scant white creamy discharge, vaginal walls and external genitalia normal Bimanual exam: Cervix 0/long/high, firm, anterior, neg CMT, uterus nontender, nonenlarged, adnexa without tenderness, enlargement, or mass        Assessment/Plan:   1. Women's annual routine gynecological examination  - Cytology - PAP( [place])  2. Screening mammogram for breast cancer --Pt to schedule after March for insurance.  - MM DIGITAL SCREENING BILATERAL; Future  3. Cervical polyp --May be source of irregular spotting, friable on exam today. --After outpatient [place], pt to f/u with MD for possible removal.  -  US PELVIC COMPLETE WITH TRANSVAGINAL; Future  4. Vaginal spotting  - US PELVIC COMPLETE WITH TRANSVAGINAL; Future  5. Acute pelvic pain, female --Cramping and pain may be related to polyp, some hx thickened endometrium 10 yrs ago.  Will f/u with [place] and MD visit.  - US PELVIC COMPLETE WITH TRANSVAGINAL; Future     Return in about 4 weeks (around 09/13/2022) for Office visit with MD AFTER outpatient ultrasound for cervical polyp removal.   Fatima Blank, CNM 10:23 AM

## 2022-08-16 NOTE — Progress Notes (Signed)
Pt presents for annual exam. Pt reports having more abdominal cramping than normal and irregular spotting. Denies odor or abnormal vaginal discharge. Last PAP abnormal; will repeat today. Last mammogram 09/07/21. Denies any abnormal breast changes. No other concerns at this time. PHQ and GAD negative.

## 2022-08-18 ENCOUNTER — Ambulatory Visit
Admission: RE | Admit: 2022-08-18 | Discharge: 2022-08-18 | Disposition: A | Discharge status: Home / Self Care | Payer: Commercial Managed Care - PPO | Source: Ambulatory Visit | Attending: Advanced Practice Midwife | Admitting: Advanced Practice Midwife

## 2022-08-18 DIAGNOSIS — N939 Abnormal uterine and vaginal bleeding, unspecified: Secondary | ICD-10-CM | POA: Diagnosis present

## 2022-08-18 DIAGNOSIS — N841 Polyp of cervix uteri: Secondary | ICD-10-CM | POA: Diagnosis present

## 2022-08-18 DIAGNOSIS — R102 Pelvic and perineal pain: Secondary | ICD-10-CM

## 2022-08-22 LAB — CYTOLOGY - PAP
Comment: NEGATIVE
Diagnosis: NEGATIVE
Diagnosis: REACTIVE
High risk HPV: NEGATIVE

## 2022-08-30 ENCOUNTER — Encounter: Payer: Self-pay | Admitting: Advanced Practice Midwife

## 2022-09-26 ENCOUNTER — Ambulatory Visit: Payer: Commercial Managed Care - PPO | Admitting: Advanced Practice Midwife

## 2022-09-26 ENCOUNTER — Other Ambulatory Visit (HOSPITAL_COMMUNITY)
Admission: RE | Admit: 2022-09-26 | Discharge: 2022-09-26 | Disposition: A | Discharge status: Home / Self Care | Payer: Commercial Managed Care - PPO | Source: Ambulatory Visit | Attending: Obstetrics and Gynecology | Admitting: Obstetrics and Gynecology

## 2022-09-26 ENCOUNTER — Ambulatory Visit: Payer: Commercial Managed Care - PPO | Admitting: Obstetrics and Gynecology

## 2022-09-26 ENCOUNTER — Encounter: Payer: Self-pay | Admitting: Obstetrics and Gynecology

## 2022-09-26 VITALS — BP 133/71 | HR 70 | Ht 63.0 in | Wt 139.0 lb

## 2022-09-26 DIAGNOSIS — N841 Polyp of cervix uteri: Secondary | ICD-10-CM | POA: Diagnosis present

## 2022-09-26 NOTE — Patient Instructions (Signed)
It is normal to have cramping for the next 2-3 days and bleeding for the next 2 weeks. You should feel better every day.  Please call us if you have any severe pain, bleeding that soaks more than 1 pad in a hour, have fevers, or feel like you're going to pass out.  You can take tylenol 1000mg  every 8 hours and ibuprofen 800mg  every 8 hours as needed for pain. It is ok to take both at the same time.   You will see your pathology report in 1 week in the MyChart app

## 2022-09-26 NOTE — Progress Notes (Signed)
   RETURN GYNECOLOGY VISIT  Subjective:  Theresa Raymond is a 52 y.o. [id number] with LMP 09/22/22 presenting for follow up of intermenstrual spotting & cramping  Reviewed note from Sharen Counter CNM on 08/16/22. Pt reported several months of spotting between regular periods and lower abdominal cramping. Pt reports regular cycles. Her exam was notable for a small cervical polyp.   I personally reviewed the following results: Pap 08/16/22 NILM/HPV neg Pap 06/28/21 LSIL/HPV neg Pelvic US 08/18/22: 8.6 x 5.0 x 6.2 cm retroflexed, mildly heterogenous uterus w/ EL 13.59mm and no focal abnormality, minimally heterogenous. Normal 4.0 x 1.9 x 2.3cm R ovary & normal 3.2 x 1.8 x 2.5cm L ovary.  Hx LEEP 20-30 years ago, and hx D&C for thickened endometrium around 10 years ago that she says was ultimately normal.   Objective:   Vitals:   09/26/22 0855  BP: 133/71  Pulse: 70  Weight: 139 lb (63 kg)  Height: 5\' 3"  (1.6 m)   General:  Alert, oriented and cooperative. Patient is in no acute distress.  Skin: Skin is warm and dry. No rash noted.   Cardiovascular: Normal heart rate noted  Respiratory: Normal respiratory effort, no problems with respiration noted  Abdomen: Soft, non-tender, non-distended   Pelvic: NEFG. Small, 65mm cervical polyp visualized. See procedure note below.  Exam performed in the presence of a chaperone  Assessment and Plan:  Theresa Raymond is a 52 y.o. presenting for follow up of intermenstrual spotting and cramping.   Intermenstrual spotting - Reviewed overall normal ultrasound with the patient. Discussed that most likely etiology of her bleeding is her cervical polyp - Offered polyp removal in office. Discussed risk of bleeding, infection, injury or need for additional procedures. Consents signed.  - We discussed need for close monitoring of her symptoms. If persistent intermenstrual bleeding, recommend endometrial biopsy. Offered EMB today, but she is agreeable to waiting  and seeing how her symptoms evolve now that her polyp is removed.   2. Cervical polyp Polyp removed today, see procedure note below - Surgical pathology( [place]/ [facility])  Return if symptoms worsen or fail to improve.  Future Appointments  Date Time Provider Department Center  10/05/2022  8:20 AM [facility] MM 3 [facility]  10/26/2022  9:00 AM Etta Grandchild, MD [facility] None   Theresa Pall, MD     GYNECOLOGY OFFICE CERVICAL POLYPECTOMY PROCEDURE NOTE  52 y.o. [id number] here for follow up of intermenstrual bleeding and removal of cervical polyp.   Informed consent and review of risks, benefit and alternatives performed. Written consent given.   Speculum inserted into patient's vagina assuring full view of cervix. 3 swabs of Hibiclens used to prep the cervix. The polyp was visualized, grasped with ring forceps and removed without difficulty. Silver nitrate applied to the cervix for hemostasis. All instruments were removed.   Pt tolerated well with minimal pain and bleeding.   All specimens were labeled and sent to pathology.  Patient was given post procedure instructions.  Will follow up pathology and manage accordingly; patient will be contacted with results and recommendations.  Theresa Bridge, MD Obstetrician & Gynecologist, [facility] for [employer], [facility]

## 2022-09-28 LAB — SURGICAL PATHOLOGY

## 2022-10-05 ENCOUNTER — Ambulatory Visit
Admission: RE | Admit: 2022-10-05 | Discharge: 2022-10-05 | Disposition: A | Discharge status: Home / Self Care | Payer: Commercial Managed Care - PPO | Source: Ambulatory Visit | Attending: Advanced Practice Midwife | Admitting: Advanced Practice Midwife

## 2022-10-05 DIAGNOSIS — Z1231 Encounter for screening mammogram for malignant neoplasm of breast: Secondary | ICD-10-CM

## 2022-10-26 ENCOUNTER — Ambulatory Visit: Payer: Commercial Managed Care - PPO | Admitting: Internal Medicine

## 2022-10-26 ENCOUNTER — Encounter: Payer: Self-pay | Admitting: Internal Medicine

## 2022-10-26 DIAGNOSIS — E039 Hypothyroidism, unspecified: Secondary | ICD-10-CM

## 2022-10-26 DIAGNOSIS — I10 Essential (primary) hypertension: Secondary | ICD-10-CM | POA: Diagnosis not present

## 2022-10-26 LAB — BASIC METABOLIC PANEL
BUN: 13 mg/dL (ref 6–23)
CO2: 31 mEq/L (ref 19–32)
Calcium: 9.5 mg/dL (ref 8.4–10.5)
Chloride: 99 mEq/L (ref 96–112)
Creatinine, Ser: 0.75 mg/dL (ref 0.40–1.20)
GFR: 90.37 mL/min (ref >60.00)
Glucose, Bld: 120 mg/dL — ABNORMAL HIGH (ref 70–99)
Potassium: 3.9 mEq/L (ref 3.5–5.1)
Sodium: 138 mEq/L (ref 135–145)

## 2022-10-26 LAB — CBC WITH DIFFERENTIAL/PLATELET
Basophils Absolute: 0.1 10*3/uL (ref 0.0–0.1)
Basophils Relative: 1.1 % (ref 0.0–3.0)
Eosinophils Absolute: 0.1 10*3/uL (ref 0.0–0.7)
Eosinophils Relative: 1.4 % (ref 0.0–5.0)
HCT: 41.4 % (ref 36.0–46.0)
Hemoglobin: 14.1 g/dL (ref 12.0–15.0)
Lymphocytes Relative: 33.6 % (ref 12.0–46.0)
Lymphs Abs: 1.5 10*3/uL (ref 0.7–4.0)
MCHC: 34.1 g/dL (ref 30.0–36.0)
MCV: 97.2 fl (ref 78.0–100.0)
Monocytes Absolute: 0.4 10*3/uL (ref 0.1–1.0)
Monocytes Relative: 9 % (ref 3.0–12.0)
Neutro Abs: 2.5 10*3/uL (ref 1.4–7.7)
Neutrophils Relative %: 54.9 % (ref 43.0–77.0)
Platelets: 287 10*3/uL (ref 150.0–400.0)
RBC: 4.26 Mil/uL (ref 3.87–5.11)
RDW: 13.6 % (ref 11.5–15.5)
WBC: 4.6 10*3/uL (ref 4.0–10.5)

## 2022-10-26 LAB — TSH: TSH: 0.85 u[IU]/mL (ref 0.35–5.50)

## 2022-10-26 MED ORDER — VALSARTAN 160 MG PO TABS: 160.0000 mg | ORAL_TABLET | Freq: Every day | ORAL | 1 refills | Status: DC

## 2022-10-26 MED ORDER — UNITHROID 100 MCG PO TABS: 100.0000 ug | ORAL_TABLET | Freq: Every day | ORAL | 1 refills | Status: DC

## 2022-10-26 NOTE — Patient Instructions (Signed)
Hypertension, Adult High blood pressure (hypertension) is when the force of blood pumping through the arteries is too strong. The arteries are the blood vessels that carry blood from the heart throughout the body. Hypertension forces the heart to work harder to pump blood and may cause arteries to become narrow or stiff. Untreated or uncontrolled hypertension can lead to a heart attack, heart failure, a stroke, kidney disease, and other problems. A blood pressure reading consists of a higher number over a lower number. Ideally, your blood pressure should be below 120/80. The first ("top") number is called the systolic pressure. It is a measure of the pressure in your arteries as your heart beats. The second ("bottom") number is called the diastolic pressure. It is a measure of the pressure in your arteries as the heart relaxes. What are the causes? The exact cause of this condition is not known. There are some conditions that result in high blood pressure. What increases the risk? Certain factors may make you more likely to develop high blood pressure. Some of these risk factors are under your control, including: Smoking. Not getting enough exercise or physical activity. Being overweight. Having too much fat, sugar, calories, or salt (sodium) in your diet. Drinking too much alcohol. Other risk factors include: Having a personal history of heart disease, diabetes, high cholesterol, or kidney disease. Stress. Having a family history of high blood pressure and high cholesterol. Having obstructive sleep apnea. Age. The risk increases with age. What are the signs or symptoms? High blood pressure may not cause symptoms. Very high blood pressure (hypertensive crisis) may cause: Headache. Fast or irregular heartbeats (palpitations). Shortness of breath. Nosebleed. Nausea and vomiting. Vision changes. Severe chest pain, dizziness, and seizures. How is this diagnosed? This condition is diagnosed by  measuring your blood pressure while you are seated, with your arm resting on a flat surface, your legs uncrossed, and your feet flat on the floor. The cuff of the blood pressure monitor will be placed directly against the skin of your upper arm at the level of your heart. Blood pressure should be measured at least twice using the same arm. Certain conditions can cause a difference in blood pressure between your right and left arms. If you have a high blood pressure reading during one visit or you have normal blood pressure with other risk factors, you may be asked to: Return on a different day to have your blood pressure checked again. Monitor your blood pressure at home for 1 week or longer. If you are diagnosed with hypertension, you may have other blood or imaging tests to help your health care provider understand your overall risk for other conditions. How is this treated? This condition is treated by making healthy lifestyle changes, such as eating healthy foods, exercising more, and reducing your alcohol intake. You may be referred for counseling on a healthy diet and physical activity. Your health care provider may prescribe medicine if lifestyle changes are not enough to get your blood pressure under control and if: Your systolic blood pressure is above 130. Your diastolic blood pressure is above 80. Your personal target blood pressure may vary depending on your medical conditions, your age, and other factors. Follow these instructions at home: Eating and drinking  Eat a diet that is high in fiber and potassium, and low in sodium, added sugar, and fat. An example of this eating plan is called the DASH diet. DASH stands for Dietary Approaches to Stop Hypertension. To eat this way: Eat   plenty of fresh fruits and vegetables. Try to fill one half of your plate at each meal with fruits and vegetables. Eat whole grains, such as whole-wheat pasta, brown rice, or whole-grain bread. Fill about one  fourth of your plate with whole grains. Eat or drink low-fat dairy products, such as skim milk or low-fat yogurt. Avoid fatty cuts of meat, processed or cured meats, and poultry with skin. Fill about one fourth of your plate with lean proteins, such as fish, chicken without skin, beans, eggs, or tofu. Avoid pre-made and processed foods. These tend to be higher in sodium, added sugar, and fat. Reduce your daily sodium intake. Many people with hypertension should eat less than 1,500 mg of sodium a day. Do not drink alcohol if: Your health care provider tells you not to drink. You are pregnant, may be pregnant, or are planning to become pregnant. If you drink alcohol: Limit how much you have to: 0-1 drink a day for women. 0-2 drinks a day for men. Know how much alcohol is in your drink. In the [place] one drink equals one 12 oz bottle of beer (355 mL), one 5 oz glass of wine (148 mL), or one 1 oz glass of hard liquor (44 mL). Lifestyle  Work with your health care provider to maintain a healthy body weight or to lose weight. Ask what an ideal weight is for you. Get at least 30 minutes of exercise that causes your heart to beat faster (aerobic exercise) most days of the week. Activities may include walking, swimming, or biking. Include exercise to strengthen your muscles (resistance exercise), such as Pilates or lifting weights, as part of your weekly exercise routine. Try to do these types of exercises for 30 minutes at least 3 days a week. Do not use any products that contain nicotine or tobacco. These products include cigarettes, chewing tobacco, and vaping devices, such as e-cigarettes. If you need help quitting, ask your health care provider. Monitor your blood pressure at home as told by your health care provider. Keep all follow-up visits. This is important. Medicines Take over-the-counter and prescription medicines only as told by your health care provider. Follow directions carefully. Blood  pressure medicines must be taken as prescribed. Do not skip doses of blood pressure medicine. Doing this puts you at risk for problems and can make the medicine less effective. Ask your health care provider about side effects or reactions to medicines that you should watch for. Contact a health care provider if you: Think you are having a reaction to a medicine you are taking. Have headaches that keep coming back (recurring). Feel dizzy. Have swelling in your ankles. Have trouble with your vision. Get help right away if you: Develop a severe headache or confusion. Have unusual weakness or numbness. Feel faint. Have severe pain in your chest or abdomen. Vomit repeatedly. Have trouble breathing. These symptoms may be an emergency. Get help right away. Call 911. Do not wait to see if the symptoms will go away. Do not drive yourself to the hospital. Summary Hypertension is when the force of blood pumping through your arteries is too strong. If this condition is not controlled, it may put you at risk for serious complications. Your personal target blood pressure may vary depending on your medical conditions, your age, and other factors. For most people, a normal blood pressure is less than 120/80. Hypertension is treated with lifestyle changes, medicines, or a combination of both. Lifestyle changes include losing weight, eating a healthy,   low-sodium diet, exercising more, and limiting alcohol. This information is not intended to replace advice given to you by your health care provider. Make sure you discuss any questions you have with your health care provider. Document Revised: 04/13/2021 Document Reviewed: 04/13/2021 Elsevier Patient Education  2023 [employer] Inc.  

## 2022-10-26 NOTE — Progress Notes (Unsigned)
Subjective:  Patient ID: Theresa Raymond, female    DOB: 1971-05-04  Age: 52 y.o. MRN: [id number]  CC: Hypertension and Hypothyroidism   HPI Theresa Raymond presents for f/up ---  She is training for a marathon.  Outpatient Medications Prior to Visit  Medication Sig Dispense Refill   EPINEPHrine (EPIPEN 2-PAK) 0.3 mg/0.3 mL IJ SOAJ injection Inject 0.3 mg into the muscle as needed for anaphylaxis. 2 each 3   Multiple Vitamins-Minerals (MULTIVITAMIN ADULT PO) Take 1 tablet by mouth daily.     indapamide (LOZOL) 1.25 MG tablet TAKE 1 TABLET DAILY 90 tablet 1   levothyroxine (SYNTHROID) 100 MCG tablet TAKE 1 TABLET DAILY BEFORE BREAKFAST 90 tablet 1   valsartan (DIOVAN) 160 MG tablet TAKE 1 TABLET DAILY 90 tablet 1   No facility-administered medications prior to visit.    ROS Review of Systems  Objective:  BP 122/74 (BP Location: Right Arm, Patient Position: Sitting, Cuff Size: Large)   Pulse 65   Temp 98.3 F (36.8 C) (Oral)   Resp 16   Ht 5\' 3"  (1.6 m)   Wt 135 lb (61.2 kg)   LMP 09/22/2022 (Exact Date)   SpO2 99%   BMI 23.91 kg/m   BP Readings from Last 3 Encounters:  10/26/22 122/74  09/26/22 133/71  08/16/22 111/71    Wt Readings from Last 3 Encounters:  10/26/22 135 lb (61.2 kg)  09/26/22 139 lb (63 kg)  08/16/22 136 lb 3.2 oz (61.8 kg)    Physical Exam Vitals reviewed.  Constitutional:      Appearance: Normal appearance.  HENT:     Nose: Nose normal.     Mouth/Throat:     Mouth: Mucous membranes are moist.  Eyes:     General: No scleral icterus.    Conjunctiva/sclera: Conjunctivae normal.  Cardiovascular:     Rate and Rhythm: Normal rate and regular rhythm.     Heart sounds: Murmur heard.     Systolic murmur is present with a grade of 1/6.     No friction rub. No gallop.  Pulmonary:     Effort: Pulmonary effort is normal.     Breath sounds: No stridor. No wheezing, rhonchi or rales.  Abdominal:     General: Abdomen is flat.     Palpations:  There is no mass.     Tenderness: There is no abdominal tenderness. There is no guarding.     Hernia: No hernia is present.  Musculoskeletal:     Cervical back: Neck supple.     Right lower leg: No edema.     Left lower leg: No edema.  Skin:    General: Skin is warm and dry.  Neurological:     General: No focal deficit present.     Mental Status: She is alert.  Psychiatric:        Mood and Affect: Mood normal.     Lab Results  Component Value Date   WBC 4.6 10/26/2022   HGB 14.1 10/26/2022   HCT 41.4 10/26/2022   PLT 287.0 10/26/2022   GLUCOSE 120 (H) 10/26/2022   CHOL 210 (H) 04/26/2021   TRIG 43.0 04/26/2021   HDL 116.30 04/26/2021   LDLCALC 85 04/26/2021   ALT 13 04/26/2021   AST 25 04/26/2021   NA 138 10/26/2022   K 3.9 10/26/2022   CL 99 10/26/2022   CREATININE 0.75 10/26/2022   BUN 13 10/26/2022   CO2 31 10/26/2022   TSH 0.85 10/26/2022   HGBA1C  5.3 07/12/2021    MM 3D SCREENING MAMMOGRAM BILATERAL BREAST  Result Date: 10/06/2022 CLINICAL DATA:  Screening. EXAM: DIGITAL SCREENING BILATERAL MAMMOGRAM WITH TOMOSYNTHESIS AND CAD TECHNIQUE: Bilateral screening digital craniocaudal and mediolateral oblique mammograms were obtained. Bilateral screening digital breast tomosynthesis was performed. The images were evaluated with computer-aided detection. COMPARISON:  Previous exam(s). ACR Breast Density Category b: There are scattered areas of fibroglandular density. FINDINGS: There are no findings suspicious for malignancy. IMPRESSION: No mammographic evidence of malignancy. A result letter of this screening mammogram will be mailed directly to the patient. RECOMMENDATION: Screening mammogram in one year. (Code:SM-B-01Y) BI-RADS CATEGORY  1: Negative. Electronically Signed   By: Elberta Fortis M.D.   On: 10/06/2022 09:16    Assessment & Plan:  Primary hypertension -     Basic metabolic panel; Future -     CBC with Differential/Platelet; Future -     TSH; Future -      Valsartan; Take 1 tablet (160 mg total) by mouth daily.  Dispense: 90 tablet; Refill: 1  Acquired hypothyroidism -     CBC with Differential/Platelet; Future -     TSH; Future -     Unithroid; Take 1 tablet (100 mcg total) by mouth daily before breakfast.  Dispense: 90 tablet; Refill: 1     Follow-up: Return in about 6 months (around 04/28/2023).  Sanda Linger, MD

## 2022-11-03 ENCOUNTER — Other Ambulatory Visit: Payer: Self-pay | Admitting: Internal Medicine

## 2022-11-03 ENCOUNTER — Encounter: Payer: Self-pay | Admitting: Internal Medicine

## 2022-11-03 DIAGNOSIS — I10 Essential (primary) hypertension: Secondary | ICD-10-CM

## 2022-11-03 MED ORDER — INDAPAMIDE 1.25 MG PO TABS
1.2500 mg | ORAL_TABLET | Freq: Every day | ORAL | 0 refills | Status: DC
Start: 2022-11-03 — End: 2022-11-04

## 2022-11-04 ENCOUNTER — Other Ambulatory Visit: Payer: Self-pay | Admitting: Internal Medicine

## 2022-11-04 DIAGNOSIS — I10 Essential (primary) hypertension: Secondary | ICD-10-CM

## 2022-11-04 MED ORDER — INDAPAMIDE 1.25 MG PO TABS: 1.2500 mg | ORAL_TABLET | Freq: Every day | ORAL | 1 refills | Status: DC

## 2022-12-13 ENCOUNTER — Encounter: Payer: Self-pay | Admitting: Internal Medicine

## 2022-12-14 NOTE — Telephone Encounter (Signed)
Called [facility] spoke w/ Barbara Cower manufacturer has change to [employer]../lmb

## 2022-12-30 ENCOUNTER — Encounter: Payer: Self-pay | Admitting: Obstetrics and Gynecology

## 2023-04-07 ENCOUNTER — Other Ambulatory Visit (HOSPITAL_BASED_OUTPATIENT_CLINIC_OR_DEPARTMENT_OTHER): Payer: Self-pay

## 2023-04-07 MED ORDER — FLULAVAL 0.5 ML IM SUSY
0.5000 mL | PREFILLED_SYRINGE | Freq: Once | INTRAMUSCULAR | 0 refills | Status: AC
  Filled 2023-04-07: qty 0.5, 1d supply, fill #0

## 2023-04-07 MED ORDER — COMIRNATY 30 MCG/0.3ML IM SUSY
0.3000 mL | PREFILLED_SYRINGE | Freq: Once | INTRAMUSCULAR | 0 refills | Status: AC
  Filled 2023-04-07: qty 0.3, 1d supply, fill #0

## 2023-05-03 ENCOUNTER — Encounter: Payer: Self-pay | Admitting: Internal Medicine

## 2023-05-03 ENCOUNTER — Ambulatory Visit: Payer: Commercial Managed Care - PPO | Admitting: Internal Medicine

## 2023-05-03 VITALS — BP 136/82 | HR 55 | Temp 98.0°F | Resp 16 | Ht 63.0 in | Wt 137.0 lb

## 2023-05-03 DIAGNOSIS — E039 Hypothyroidism, unspecified: Secondary | ICD-10-CM | POA: Diagnosis not present

## 2023-05-03 DIAGNOSIS — I10 Essential (primary) hypertension: Secondary | ICD-10-CM | POA: Diagnosis not present

## 2023-05-03 LAB — CBC WITH DIFFERENTIAL/PLATELET
Basophils Absolute: 0.1 10*3/uL (ref 0.0–0.1)
Basophils Relative: 1 % (ref 0.0–3.0)
Eosinophils Absolute: 0.1 10*3/uL (ref 0.0–0.7)
Eosinophils Relative: 1.4 % (ref 0.0–5.0)
HCT: 41.4 % (ref 36.0–46.0)
Hemoglobin: 13.9 g/dL (ref 12.0–15.0)
Lymphocytes Relative: 35.3 % (ref 12.0–46.0)
Lymphs Abs: 1.8 10*3/uL (ref 0.7–4.0)
MCHC: 33.6 g/dL (ref 30.0–36.0)
MCV: 97.5 fL (ref 78.0–100.0)
Monocytes Absolute: 0.5 10*3/uL (ref 0.1–1.0)
Monocytes Relative: 9.5 % (ref 3.0–12.0)
Neutro Abs: 2.7 10*3/uL (ref 1.4–7.7)
Neutrophils Relative %: 52.8 % (ref 43.0–77.0)
Platelets: 260 10*3/uL (ref 150.0–400.0)
RBC: 4.24 Mil/uL (ref 3.87–5.11)
RDW: 13.1 % (ref 11.5–15.5)
WBC: 5.1 10*3/uL (ref 4.0–10.5)

## 2023-05-03 LAB — BASIC METABOLIC PANEL
BUN: 17 mg/dL (ref 6–23)
CO2: 29 meq/L (ref 19–32)
Calcium: 9.5 mg/dL (ref 8.4–10.5)
Chloride: 101 meq/L (ref 96–112)
Creatinine, Ser: 0.73 mg/dL (ref 0.40–1.20)
GFR: 93.02 mL/min (ref >60.00)
Glucose, Bld: 111 mg/dL — ABNORMAL HIGH (ref 70–99)
Potassium: 3.9 meq/L (ref 3.5–5.1)
Sodium: 138 meq/L (ref 135–145)

## 2023-05-03 LAB — TSH: TSH: 0.96 u[IU]/mL (ref 0.35–5.50)

## 2023-05-03 NOTE — Patient Instructions (Signed)
Hypertension, Adult High blood pressure (hypertension) is when the force of blood pumping through the arteries is too strong. The arteries are the blood vessels that carry blood from the heart throughout the body. Hypertension forces the heart to work harder to pump blood and may cause arteries to become narrow or stiff. Untreated or uncontrolled hypertension can lead to a heart attack, heart failure, a stroke, kidney disease, and other problems. A blood pressure reading consists of a higher number over a lower number. Ideally, your blood pressure should be below 120/80. The first ("top") number is called the systolic pressure. It is a measure of the pressure in your arteries as your heart beats. The second ("bottom") number is called the diastolic pressure. It is a measure of the pressure in your arteries as the heart relaxes. What are the causes? The exact cause of this condition is not known. There are some conditions that result in high blood pressure. What increases the risk? Certain factors may make you more likely to develop high blood pressure. Some of these risk factors are under your control, including: Smoking. Not getting enough exercise or physical activity. Being overweight. Having too much fat, sugar, calories, or salt (sodium) in your diet. Drinking too much alcohol. Other risk factors include: Having a personal history of heart disease, diabetes, high cholesterol, or kidney disease. Stress. Having a family history of high blood pressure and high cholesterol. Having obstructive sleep apnea. Age. The risk increases with age. What are the signs or symptoms? High blood pressure may not cause symptoms. Very high blood pressure (hypertensive crisis) may cause: Headache. Fast or irregular heartbeats (palpitations). Shortness of breath. Nosebleed. Nausea and vomiting. Vision changes. Severe chest pain, dizziness, and seizures. How is this diagnosed? This condition is diagnosed by  measuring your blood pressure while you are seated, with your arm resting on a flat surface, your legs uncrossed, and your feet flat on the floor. The cuff of the blood pressure monitor will be placed directly against the skin of your upper arm at the level of your heart. Blood pressure should be measured at least twice using the same arm. Certain conditions can cause a difference in blood pressure between your right and left arms. If you have a high blood pressure reading during one visit or you have normal blood pressure with other risk factors, you may be asked to: Return on a different day to have your blood pressure checked again. Monitor your blood pressure at home for 1 week or longer. If you are diagnosed with hypertension, you may have other blood or imaging tests to help your health care provider understand your overall risk for other conditions. How is this treated? This condition is treated by making healthy lifestyle changes, such as eating healthy foods, exercising more, and reducing your alcohol intake. You may be referred for counseling on a healthy diet and physical activity. Your health care provider may prescribe medicine if lifestyle changes are not enough to get your blood pressure under control and if: Your systolic blood pressure is above 130. Your diastolic blood pressure is above 80. Your personal target blood pressure may vary depending on your medical conditions, your age, and other factors. Follow these instructions at home: Eating and drinking  Eat a diet that is high in fiber and potassium, and low in sodium, added sugar, and fat. An example of this eating plan is called the DASH diet. DASH stands for Dietary Approaches to Stop Hypertension. To eat this way: Eat   plenty of fresh fruits and vegetables. Try to fill one half of your plate at each meal with fruits and vegetables. Eat whole grains, such as whole-wheat pasta, brown rice, or whole-grain bread. Fill about one  fourth of your plate with whole grains. Eat or drink low-fat dairy products, such as skim milk or low-fat yogurt. Avoid fatty cuts of meat, processed or cured meats, and poultry with skin. Fill about one fourth of your plate with lean proteins, such as fish, chicken without skin, beans, eggs, or tofu. Avoid pre-made and processed foods. These tend to be higher in sodium, added sugar, and fat. Reduce your daily sodium intake. Many people with hypertension should eat less than 1,500 mg of sodium a day. Do not drink alcohol if: Your health care provider tells you not to drink. You are pregnant, may be pregnant, or are planning to become pregnant. If you drink alcohol: Limit how much you have to: 0-1 drink a day for women. 0-2 drinks a day for men. Know how much alcohol is in your drink. In the [place] one drink equals one 12 oz bottle of beer (355 mL), one 5 oz glass of wine (148 mL), or one 1 oz glass of hard liquor (44 mL). Lifestyle  Work with your health care provider to maintain a healthy body weight or to lose weight. Ask what an ideal weight is for you. Get at least 30 minutes of exercise that causes your heart to beat faster (aerobic exercise) most days of the week. Activities may include walking, swimming, or biking. Include exercise to strengthen your muscles (resistance exercise), such as Pilates or lifting weights, as part of your weekly exercise routine. Try to do these types of exercises for 30 minutes at least 3 days a week. Do not use any products that contain nicotine or tobacco. These products include cigarettes, chewing tobacco, and vaping devices, such as e-cigarettes. If you need help quitting, ask your health care provider. Monitor your blood pressure at home as told by your health care provider. Keep all follow-up visits. This is important. Medicines Take over-the-counter and prescription medicines only as told by your health care provider. Follow directions carefully. Blood  pressure medicines must be taken as prescribed. Do not skip doses of blood pressure medicine. Doing this puts you at risk for problems and can make the medicine less effective. Ask your health care provider about side effects or reactions to medicines that you should watch for. Contact a health care provider if you: Think you are having a reaction to a medicine you are taking. Have headaches that keep coming back (recurring). Feel dizzy. Have swelling in your ankles. Have trouble with your vision. Get help right away if you: Develop a severe headache or confusion. Have unusual weakness or numbness. Feel faint. Have severe pain in your chest or abdomen. Vomit repeatedly. Have trouble breathing. These symptoms may be an emergency. Get help right away. Call 911. Do not wait to see if the symptoms will go away. Do not drive yourself to the hospital. Summary Hypertension is when the force of blood pumping through your arteries is too strong. If this condition is not controlled, it may put you at risk for serious complications. Your personal target blood pressure may vary depending on your medical conditions, your age, and other factors. For most people, a normal blood pressure is less than 120/80. Hypertension is treated with lifestyle changes, medicines, or a combination of both. Lifestyle changes include losing weight, eating a healthy,   low-sodium diet, exercising more, and limiting alcohol. This information is not intended to replace advice given to you by your health care provider. Make sure you discuss any questions you have with your health care provider. Document Revised: 04/13/2021 Document Reviewed: 04/13/2021 Elsevier Patient Education  2024 Elsevier Inc.  

## 2023-05-03 NOTE — Progress Notes (Unsigned)
Subjective:  Patient ID: Theresa Raymond, female    DOB: 05-Oct-1970  Age: 52 y.o. MRN: [id number]  CC: Hypothyroidism and Hypertension   HPI Theresa Raymond presents for f/up ---   Discussed the use of AI scribe software for clinical note transcription with the patient, who gave verbal consent to proceed.  History of Present Illness   The patient, an active individual with a history of hypothyroidism, presents for a routine check-up. She reports no adverse effects from her current medications and believes her thyroid dose is appropriate. She denies any symptoms of thyroid imbalance such as constipation, diarrhea, excessive sleep, fatigue, weight changes, jitteriness, or swelling. She occasionally feels lethargic, attributing it to her active lifestyle.  The patient denies any cardiac symptoms such as dizziness, lightheadedness, or syncope. She continues to maintain an active lifestyle, including running, without any issues.  She reports seasonal allergies, particularly to leaves, which cause congestion and watery eyes. However, she denies any respiratory distress. The patient's blood pressure is well-controlled, and she has no other health concerns at this time.       Outpatient Medications Prior to Visit  Medication Sig Dispense Refill   EPINEPHrine (EPIPEN 2-PAK) 0.3 mg/0.3 mL IJ SOAJ injection Inject 0.3 mg into the muscle as needed for anaphylaxis. 2 each 3   indapamide (LOZOL) 1.25 MG tablet Take 1 tablet (1.25 mg total) by mouth daily. 90 tablet 1   Multiple Vitamins-Minerals (MULTIVITAMIN ADULT PO) Take 1 tablet by mouth daily.     UNITHROID 100 MCG tablet Take 1 tablet (100 mcg total) by mouth daily before breakfast. 90 tablet 1   valsartan (DIOVAN) 160 MG tablet Take 1 tablet (160 mg total) by mouth daily. 90 tablet 1   No facility-administered medications prior to visit.    ROS Review of Systems  Objective:  BP 136/82 (BP Location: Left Arm, Patient Position: Sitting,  Cuff Size: Normal)   Pulse (!) 55   Temp 98 F (36.7 C) (Oral)   Resp 16   Ht 5\' 3"  (1.6 m)   Wt 137 lb (62.1 kg)   LMP 05/02/2023   SpO2 98%   BMI 24.27 kg/m   BP Readings from Last 3 Encounters:  05/03/23 136/82  10/26/22 122/74  09/26/22 133/71    Wt Readings from Last 3 Encounters:  05/03/23 137 lb (62.1 kg)  10/26/22 135 lb (61.2 kg)  09/26/22 139 lb (63 kg)    Physical Exam Cardiovascular:     Rate and Rhythm: Regular rhythm. Bradycardia present.     Lab Results  Component Value Date   WBC 4.6 10/26/2022   HGB 14.1 10/26/2022   HCT 41.4 10/26/2022   PLT 287.0 10/26/2022   GLUCOSE 120 (H) 10/26/2022   CHOL 210 (H) 04/26/2021   TRIG 43.0 04/26/2021   HDL 116.30 04/26/2021   LDLCALC 85 04/26/2021   ALT 13 04/26/2021   AST 25 04/26/2021   NA 138 10/26/2022   K 3.9 10/26/2022   CL 99 10/26/2022   CREATININE 0.75 10/26/2022   BUN 13 10/26/2022   CO2 31 10/26/2022   TSH 0.85 10/26/2022   HGBA1C 5.3 07/12/2021    MM 3D SCREENING MAMMOGRAM BILATERAL BREAST  Result Date: 10/06/2022 CLINICAL DATA:  Screening. EXAM: DIGITAL SCREENING BILATERAL MAMMOGRAM WITH TOMOSYNTHESIS AND CAD TECHNIQUE: Bilateral screening digital craniocaudal and mediolateral oblique mammograms were obtained. Bilateral screening digital breast tomosynthesis was performed. The images were evaluated with computer-aided detection. COMPARISON:  Previous exam(s). ACR Breast Density Category b: There  are scattered areas of fibroglandular density. FINDINGS: There are no findings suspicious for malignancy. IMPRESSION: No mammographic evidence of malignancy. A result letter of this screening mammogram will be mailed directly to the patient. RECOMMENDATION: Screening mammogram in one year. (Code:SM-B-01Y) BI-RADS CATEGORY  1: Negative. Electronically Signed   By: Elberta Fortis M.D.   On: 10/06/2022 09:16    Assessment & Plan:  Acquired hypothyroidism -     TSH; Future  Primary hypertension -      Basic metabolic panel; Future -     CBC with Differential/Platelet; Future     Follow-up: No follow-ups on file.  Sanda Linger, MD

## 2023-05-04 MED ORDER — UNITHROID 100 MCG PO TABS: 100.0000 ug | ORAL_TABLET | Freq: Every day | ORAL | 1 refills | Status: DC

## 2023-05-04 MED ORDER — VALSARTAN 160 MG PO TABS: 160.0000 mg | ORAL_TABLET | Freq: Every day | ORAL | 1 refills | Status: DC

## 2023-05-04 MED ORDER — INDAPAMIDE 1.25 MG PO TABS: 1.2500 mg | ORAL_TABLET | Freq: Every day | ORAL | 1 refills | Status: DC

## 2023-11-17 ENCOUNTER — Other Ambulatory Visit: Payer: Self-pay | Admitting: Internal Medicine

## 2023-11-17 DIAGNOSIS — E039 Hypothyroidism, unspecified: Secondary | ICD-10-CM

## 2023-11-17 DIAGNOSIS — I10 Essential (primary) hypertension: Secondary | ICD-10-CM

## 2023-11-29 ENCOUNTER — Other Ambulatory Visit: Payer: Self-pay | Admitting: Advanced Practice Midwife

## 2023-11-29 DIAGNOSIS — Z1231 Encounter for screening mammogram for malignant neoplasm of breast: Secondary | ICD-10-CM

## 2023-12-01 ENCOUNTER — Encounter: Payer: Self-pay | Admitting: Internal Medicine

## 2023-12-01 ENCOUNTER — Ambulatory Visit (INDEPENDENT_AMBULATORY_CARE_PROVIDER_SITE_OTHER): Admitting: Internal Medicine

## 2023-12-01 VITALS — BP 128/80 | HR 57 | Temp 98.1°F | Resp 16 | Ht 63.0 in | Wt 137.8 lb

## 2023-12-01 DIAGNOSIS — E039 Hypothyroidism, unspecified: Secondary | ICD-10-CM

## 2023-12-01 DIAGNOSIS — R739 Hyperglycemia, unspecified: Secondary | ICD-10-CM

## 2023-12-01 DIAGNOSIS — R001 Bradycardia, unspecified: Secondary | ICD-10-CM | POA: Diagnosis not present

## 2023-12-01 DIAGNOSIS — Z0001 Encounter for general adult medical examination with abnormal findings: Secondary | ICD-10-CM

## 2023-12-01 DIAGNOSIS — I10 Essential (primary) hypertension: Secondary | ICD-10-CM | POA: Diagnosis not present

## 2023-12-01 DIAGNOSIS — Z0000 Encounter for general adult medical examination without abnormal findings: Secondary | ICD-10-CM

## 2023-12-01 LAB — URINALYSIS, ROUTINE W REFLEX MICROSCOPIC
Bilirubin Urine: NEGATIVE
Hgb urine dipstick: NEGATIVE
Ketones, ur: NEGATIVE
Leukocytes,Ua: NEGATIVE
Nitrite: NEGATIVE
RBC / HPF: NONE SEEN (ref 0–?)
Specific Gravity, Urine: <=1.005 — AB (ref 1.000–1.030)
Total Protein, Urine: NEGATIVE
Urine Glucose: NEGATIVE
Urobilinogen, UA: 0.2 (ref 0.0–1.0)
WBC, UA: NONE SEEN (ref 0–?)
pH: 6.5 (ref 5.0–8.0)

## 2023-12-01 NOTE — Patient Instructions (Signed)

## 2023-12-01 NOTE — Progress Notes (Unsigned)
 Subjective:  Patient ID: Theresa Raymond, female    DOB: [birth year]  Age: 53 y.o. MRN: [id number]  CC: Annual Exam (No Concerns. ), Hypertension, and Hypothyroidism   HPI Theresa Raymond presents for a CPX and f/up ----  Discussed the use of AI scribe software for clinical note transcription with the patient, who gave verbal consent to proceed.  History of Present Illness   Theresa Raymond is a 53 year old female who presents with concerns about low heart rate and fatigue.  She has a heart rate of 57, occasionally dropping below 45, as monitored by her watch. She experiences occasional dizziness and lightheadedness but no palpitations or near syncope. She recalls a previous cardiology evaluation for similar symptoms, suspected to be related to COVID, with an abnormal but stable EKG over time.  She has been experiencing increased fatigue, which she suspects may be related to her thyroid  medication. Despite remaining active, she notices fatigue earlier in the day. No weight gain, constipation, abdominal pain, or neck pain and swelling are reported.       Outpatient Medications Prior to Visit  Medication Sig Dispense Refill   EPINEPHrine  (EPIPEN  2-PAK) 0.3 mg/0.3 mL IJ SOAJ injection Inject 0.3 mg into the muscle as needed for anaphylaxis. 2 each 3   indapamide  (LOZOL ) 1.25 MG tablet Take 1 tablet (1.25 mg total) by mouth daily. 90 tablet 1   Multiple Vitamins-Minerals (MULTIVITAMIN ADULT PO) Take 1 tablet by mouth daily.     UNITHROID  100 MCG tablet Take 1 tablet (100 mcg total) by mouth daily before breakfast. 90 tablet 1   valsartan  (DIOVAN ) 160 MG tablet Take 1 tablet (160 mg total) by mouth daily. 90 tablet 1   No facility-administered medications prior to visit.    ROS Review of Systems  Constitutional:  Positive for fatigue.    Objective:  BP 128/80 (BP Location: Left Arm, Patient Position: Sitting, Cuff Size: Small)   Pulse (!) 57   Temp 98.1 F (36.7 C) (Oral)   Resp  16   Ht 5' 3 (1.6 m)   Wt 137 lb 12.8 oz (62.5 kg)   LMP 11/09/2023   SpO2 99%   BMI 24.41 kg/m   BP Readings from Last 3 Encounters:  12/01/23 128/80  05/03/23 136/82  10/26/22 122/74    Wt Readings from Last 3 Encounters:  12/01/23 137 lb 12.8 oz (62.5 kg)  05/03/23 137 lb (62.1 kg)  10/26/22 135 lb (61.2 kg)    Physical Exam Vitals reviewed.  Constitutional:      Appearance: Normal appearance.  HENT:     Nose: Nose normal.     Mouth/Throat:     Mouth: Mucous membranes are moist.   Eyes:     General: No scleral icterus.    Conjunctiva/sclera: Conjunctivae normal.    Cardiovascular:     Rate and Rhythm: Regular rhythm. Bradycardia present.     Heart sounds: No murmur heard.    No friction rub. No gallop.     Comments: EKG-- SB, 58 bpm LAFB Anterior infarct pattern Unchanged  Pulmonary:     Effort: Pulmonary effort is normal.     Breath sounds: No stridor. No wheezing, rhonchi or rales.  Abdominal:     General: Abdomen is flat.     Palpations: There is no mass.     Tenderness: There is no abdominal tenderness. There is no guarding.     Hernia: No hernia is present.   Musculoskeletal:  General: Normal range of motion.     Cervical back: Neck supple.     Right lower leg: No edema.     Left lower leg: No edema.  Lymphadenopathy:     Cervical: No cervical adenopathy.   Skin:    General: Skin is warm and dry.   Neurological:     General: No focal deficit present.     Mental Status: She is alert. Mental status is at baseline.   Psychiatric:        Mood and Affect: Mood normal.        Behavior: Behavior normal.     Lab Results  Component Value Date   WBC 5.1 05/03/2023   HGB 13.9 05/03/2023   HCT 41.4 05/03/2023   PLT 260.0 05/03/2023   GLUCOSE 111 (H) 05/03/2023   CHOL 210 (H) 04/26/2021   TRIG 43.0 04/26/2021   HDL 116.30 04/26/2021   LDLCALC 85 04/26/2021   ALT 13 04/26/2021   AST 25 04/26/2021   NA 138 05/03/2023   K 3.9  05/03/2023   CL 101 05/03/2023   CREATININE 0.73 05/03/2023   BUN 17 05/03/2023   CO2 29 05/03/2023   TSH 0.96 05/03/2023   HGBA1C 5.3 07/12/2021    MM 3D SCREENING MAMMOGRAM BILATERAL BREAST Result Date: 10/06/2022 CLINICAL DATA:  Screening. EXAM: DIGITAL SCREENING BILATERAL MAMMOGRAM WITH TOMOSYNTHESIS AND CAD TECHNIQUE: Bilateral screening digital craniocaudal and mediolateral oblique mammograms were obtained. Bilateral screening digital breast tomosynthesis was performed. The images were evaluated with computer-aided detection. COMPARISON:  Previous exam(s). ACR Breast Density Category b: There are scattered areas of fibroglandular density. FINDINGS: There are no findings suspicious for malignancy. IMPRESSION: No mammographic evidence of malignancy. A result letter of this screening mammogram will be mailed directly to the patient. RECOMMENDATION: Screening mammogram in one year. (Code:SM-B-01Y) BI-RADS CATEGORY  1: Negative. Electronically Signed   By: Roda Cirri M.D.   On: 10/06/2022 09:16    Assessment & Plan:  Acquired hypothyroidism -     TSH; Future -     Hepatic function panel; Future -     CBC with Differential/Platelet; Future  Primary hypertension -     Urinalysis, Routine w reflex microscopic; Future -     Hepatic function panel; Future -     Hemoglobin A1c; Future -     Basic metabolic panel with GFR; Future -     CBC with Differential/Platelet; Future  Chronic hyperglycemia -     Hepatic function panel; Future -     Basic metabolic panel with GFR; Future  Encounter for general adult medical examination with abnormal findings -     Lipid panel; Future  Bradycardia -     EKG 12-Lead     Follow-up: Return in about 6 months (around 06/01/2024).  Sandra Crouch, MD

## 2023-12-02 ENCOUNTER — Ambulatory Visit: Payer: Self-pay | Admitting: Internal Medicine

## 2023-12-02 LAB — HEPATIC FUNCTION PANEL
AG Ratio: 2.1 (calc) (ref 1.0–2.5)
ALT: 12 U/L (ref 6–29)
AST: 22 U/L (ref 10–35)
Albumin: 5 g/dL (ref 3.6–5.1)
Alkaline phosphatase (APISO): 29 U/L — ABNORMAL LOW (ref 37–153)
Bilirubin, Direct: 0.2 mg/dL (ref 0.0–0.2)
Globulin: 2.4 g/dL (ref 1.9–3.7)
Indirect Bilirubin: 0.8 mg/dL (ref 0.2–1.2)
Total Bilirubin: 1 mg/dL (ref 0.2–1.2)
Total Protein: 7.4 g/dL (ref 6.1–8.1)

## 2023-12-02 LAB — HEMOGLOBIN A1C
Hgb A1c MFr Bld: 5.7 % — ABNORMAL HIGH (ref <5.7)
Mean Plasma Glucose: 117 mg/dL
eAG (mmol/L): 6.5 mmol/L

## 2023-12-02 LAB — CBC WITH DIFFERENTIAL/PLATELET
Absolute Lymphocytes: 2160 {cells}/uL (ref 850–3900)
Absolute Monocytes: 559 {cells}/uL (ref 200–950)
Basophils Absolute: 29 {cells}/uL (ref 0–200)
Basophils Relative: 0.5 %
Eosinophils Absolute: 63 {cells}/uL (ref 15–500)
Eosinophils Relative: 1.1 %
HCT: 40.9 % (ref 35.0–45.0)
Hemoglobin: 13.7 g/dL (ref 11.7–15.5)
MCH: 32.4 pg (ref 27.0–33.0)
MCHC: 33.5 g/dL (ref 32.0–36.0)
MCV: 96.7 fL (ref 80.0–100.0)
MPV: 9.9 fL (ref 7.5–12.5)
Monocytes Relative: 9.8 %
Neutro Abs: 2890 {cells}/uL (ref 1500–7800)
Neutrophils Relative %: 50.7 %
Platelets: 270 10*3/uL (ref 140–400)
RBC: 4.23 10*6/uL (ref 3.80–5.10)
RDW: 12.1 % (ref 11.0–15.0)
Total Lymphocyte: 37.9 %
WBC: 5.7 10*3/uL (ref 3.8–10.8)

## 2023-12-02 LAB — BASIC METABOLIC PANEL WITH GFR
BUN: 17 mg/dL (ref 7–25)
CO2: 26 mmol/L (ref 20–32)
Calcium: 9.6 mg/dL (ref 8.6–10.4)
Chloride: 99 mmol/L (ref 98–110)
Creat: 0.81 mg/dL (ref 0.50–1.03)
Glucose, Bld: 107 mg/dL — ABNORMAL HIGH (ref 65–99)
Potassium: 3.5 mmol/L (ref 3.5–5.3)
Sodium: 138 mmol/L (ref 135–146)
eGFR: 86 mL/min/{1.73_m2} (ref >=60)

## 2023-12-02 LAB — LIPID PANEL
Cholesterol: 211 mg/dL — ABNORMAL HIGH (ref <200)
HDL: 130 mg/dL (ref >=50)
LDL Cholesterol (Calc): 67 mg/dL
Non-HDL Cholesterol (Calc): 81 mg/dL (ref <130)
Total CHOL/HDL Ratio: 1.6 (calc) (ref <5.0)
Triglycerides: 48 mg/dL (ref <150)

## 2023-12-02 LAB — TSH: TSH: 0.44 m[IU]/L

## 2023-12-02 MED ORDER — INDAPAMIDE 1.25 MG PO TABS
1.2500 mg | ORAL_TABLET | Freq: Every day | ORAL | 1 refills | Status: DC
Start: 2023-12-02 — End: 2024-05-15

## 2023-12-02 MED ORDER — VALSARTAN 160 MG PO TABS
160.0000 mg | ORAL_TABLET | Freq: Every day | ORAL | 1 refills | Status: DC
Start: 2023-12-02 — End: 2024-05-15

## 2023-12-02 MED ORDER — UNITHROID 100 MCG PO TABS: 100.0000 ug | ORAL_TABLET | Freq: Every day | ORAL | 1 refills | Status: DC

## 2023-12-08 ENCOUNTER — Ambulatory Visit
Admission: RE | Admit: 2023-12-08 | Discharge: 2023-12-08 | Disposition: A | Discharge status: Home / Self Care | Source: Ambulatory Visit | Attending: Advanced Practice Midwife | Admitting: Advanced Practice Midwife

## 2023-12-08 DIAGNOSIS — Z1231 Encounter for screening mammogram for malignant neoplasm of breast: Secondary | ICD-10-CM

## 2023-12-09 ENCOUNTER — Telehealth: Admitting: Family Medicine

## 2023-12-09 DIAGNOSIS — N3000 Acute cystitis without hematuria: Secondary | ICD-10-CM | POA: Diagnosis not present

## 2023-12-09 MED ORDER — NITROFURANTOIN MONOHYD MACRO 100 MG PO CAPS: 100.0000 mg | ORAL_CAPSULE | Freq: Two times a day (BID) | ORAL | 0 refills | Status: AC

## 2023-12-09 NOTE — Progress Notes (Signed)
 Virtual Visit Consent   Theresa Raymond, you are scheduled for a virtual visit with a [facility] provider today. Just as with appointments in the office, your consent must be obtained to participate. Your consent will be active for this visit and any virtual visit you may have with one of our providers in the next 365 days. If you have a MyChart account, a copy of this consent can be sent to you electronically.  As this is a virtual visit, video technology does not allow for your provider to perform a traditional examination. This may limit your provider's ability to fully assess your condition. If your provider identifies any concerns that need to be evaluated in person or the need to arrange testing (such as labs, EKG, etc.), we will make arrangements to do so. Although advances in technology are sophisticated, we cannot ensure that it will always work on either your end or our end. If the connection with a video visit is poor, the visit may have to be switched to a telephone visit. With either a video or telephone visit, we are not always able to ensure that we have a secure connection.  By engaging in this virtual visit, you consent to the provision of healthcare and authorize for your insurance to be billed (if applicable) for the services provided during this visit. Depending on your insurance coverage, you may receive a charge related to this service.  I need to obtain your verbal consent now. Are you willing to proceed with your visit today? Theresa Raymond has provided verbal consent on 12/09/2023 for a virtual visit (video or telephone). Loa Lamp, FNP  Date: 12/09/2023 2:16 PM   Virtual Visit via Video Note   I, Loa Lamp, connected with  Theresa Raymond  ([id number], [birth year]) on 12/09/23 at  2:15 PM EDT by a video-enabled telemedicine application and verified that I am speaking with the correct person using two identifiers.  Location: Patient: Virtual Visit Location Patient:  Home Provider: Virtual Visit Location Provider: Home Office   I discussed the limitations of evaluation and management by telemedicine and the availability of in person appointments. The patient expressed understanding and agreed to proceed.    History of Present Illness: Theresa Raymond is a 53 y.o. who identifies as a female who was assigned female at birth, and is being seen today for burning, pressure and frequency on urination, No fever or abd pain. She says she has not had one in a couple of yrs but went on a ling run this week then had anal sex and sx started.   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   History of Osgood-Schlatter disease 08/28/2020   Eczema 03/11/2019   HTN (hypertension) 09/18/2018   Hypothyroidism 03/01/2018    Allergies:  Allergies  Allergen Reactions   Other Swelling and Other (See Comments)    TREE NUTS make patient's throat tight, but her breathing doesn't become impaired (I asked)   Shellfish Allergy Anaphylaxis   Medications:  Current Outpatient Medications:    EPINEPHrine  (EPIPEN  2-PAK) 0.3 mg/0.3 mL IJ SOAJ injection, Inject  0.3 mg into the muscle as needed for anaphylaxis., Disp: 2 each, Rfl: 3   indapamide  (LOZOL ) 1.25 MG tablet, Take 1 tablet (1.25 mg total) by mouth daily., Disp: 90 tablet, Rfl: 1   Multiple Vitamins-Minerals (MULTIVITAMIN ADULT PO), Take 1 tablet by mouth daily., Disp: , Rfl:    UNITHROID  100 MCG tablet, Take 1 tablet (100 mcg total) by mouth daily before breakfast., Disp: 90  tablet, Rfl: 1   valsartan  (DIOVAN ) 160 MG tablet, Take 1 tablet (160 mg total) by mouth daily., Disp: 90 tablet, Rfl: 1  Observations/Objective: Patient is well-developed, well-nourished in no acute distress.  Resting comfortably  at home.  Head is normocephalic, atraumatic.  No labored breathing.  Speech is clear and coherent with logical content.  Patient is alert and oriented at baseline.    Assessment and Plan: 1. Acute cystitis without hematuria (Primary)  Increase fluids, preventative measures discussed, UC as needed.  Follow Up Instructions: I discussed the assessment and treatment plan with the patient. The patient was provided an opportunity to ask questions and all were answered. The patient agreed with the plan and demonstrated an understanding of the instructions.  A copy of instructions were sent to the patient via MyChart unless otherwise noted below.     The patient was advised to call back or seek an in-person evaluation if the symptoms worsen or if the condition fails to improve as anticipated.    Yassir Enis, FNP

## 2023-12-09 NOTE — Patient Instructions (Signed)

## 2023-12-13 ENCOUNTER — Other Ambulatory Visit: Payer: Self-pay | Admitting: Advanced Practice Midwife

## 2023-12-13 DIAGNOSIS — R928 Other abnormal and inconclusive findings on diagnostic imaging of breast: Secondary | ICD-10-CM

## 2023-12-15 ENCOUNTER — Ambulatory Visit
Admission: RE | Admit: 2023-12-15 | Discharge: 2023-12-15 | Disposition: A | Discharge status: Home / Self Care | Source: Ambulatory Visit | Attending: Advanced Practice Midwife | Admitting: Advanced Practice Midwife

## 2023-12-15 DIAGNOSIS — R928 Other abnormal and inconclusive findings on diagnostic imaging of breast: Secondary | ICD-10-CM

## 2024-05-15 ENCOUNTER — Other Ambulatory Visit: Payer: Self-pay | Admitting: Internal Medicine

## 2024-05-15 DIAGNOSIS — E039 Hypothyroidism, unspecified: Secondary | ICD-10-CM

## 2024-05-15 DIAGNOSIS — I10 Essential (primary) hypertension: Secondary | ICD-10-CM

## 2024-06-03 ENCOUNTER — Ambulatory Visit: Payer: Self-pay | Admitting: Internal Medicine

## 2024-06-03 ENCOUNTER — Encounter: Payer: Self-pay | Admitting: Internal Medicine

## 2024-06-03 ENCOUNTER — Ambulatory Visit: Admitting: Internal Medicine

## 2024-06-03 ENCOUNTER — Ambulatory Visit (INDEPENDENT_AMBULATORY_CARE_PROVIDER_SITE_OTHER)

## 2024-06-03 VITALS — BP 134/86 | HR 55 | Temp 97.7°F | Ht 63.0 in | Wt 132.6 lb

## 2024-06-03 DIAGNOSIS — Z23 Encounter for immunization: Secondary | ICD-10-CM | POA: Insufficient documentation

## 2024-06-03 DIAGNOSIS — E039 Hypothyroidism, unspecified: Secondary | ICD-10-CM

## 2024-06-03 DIAGNOSIS — S6992XA Unspecified injury of left wrist, hand and finger(s), initial encounter: Secondary | ICD-10-CM | POA: Diagnosis not present

## 2024-06-03 DIAGNOSIS — R739 Hyperglycemia, unspecified: Secondary | ICD-10-CM

## 2024-06-03 DIAGNOSIS — I10 Essential (primary) hypertension: Secondary | ICD-10-CM

## 2024-06-03 LAB — CBC WITH DIFFERENTIAL/PLATELET
Basophils Absolute: 0 K/uL (ref 0.0–0.1)
Basophils Relative: 0.9 % (ref 0.0–3.0)
Eosinophils Absolute: 0.1 K/uL (ref 0.0–0.7)
Eosinophils Relative: 1.8 % (ref 0.0–5.0)
HCT: 37.3 % (ref 36.0–46.0)
Hemoglobin: 13 g/dL (ref 12.0–15.0)
Lymphocytes Relative: 38.1 % (ref 12.0–46.0)
Lymphs Abs: 1.6 K/uL (ref 0.7–4.0)
MCHC: 34.9 g/dL (ref 30.0–36.0)
MCV: 93.1 fl (ref 78.0–100.0)
Monocytes Absolute: 0.3 K/uL (ref 0.1–1.0)
Monocytes Relative: 8 % (ref 3.0–12.0)
Neutro Abs: 2.2 K/uL (ref 1.4–7.7)
Neutrophils Relative %: 51.2 % (ref 43.0–77.0)
Platelets: 253 K/uL (ref 150.0–400.0)
RBC: 4.01 Mil/uL (ref 3.87–5.11)
RDW: 13 % (ref 11.5–15.5)
WBC: 4.3 K/uL (ref 4.0–10.5)

## 2024-06-03 LAB — BASIC METABOLIC PANEL WITH GFR
BUN: 13 mg/dL (ref 6–23)
CO2: 29 meq/L (ref 19–32)
Calcium: 9.5 mg/dL (ref 8.4–10.5)
Chloride: 100 meq/L (ref 96–112)
Creatinine, Ser: 0.68 mg/dL (ref 0.40–1.20)
GFR: 97.76 mL/min (ref >60.00)
Glucose, Bld: 108 mg/dL — ABNORMAL HIGH (ref 70–99)
Potassium: 3.5 meq/L (ref 3.5–5.1)
Sodium: 137 meq/L (ref 135–145)

## 2024-06-03 LAB — HEMOGLOBIN A1C: Hgb A1c MFr Bld: 5.5 % (ref 4.6–6.5)

## 2024-06-03 LAB — TSH: TSH: 1.14 u[IU]/mL (ref 0.35–5.50)

## 2024-06-03 NOTE — Progress Notes (Unsigned)
 Subjective:  Patient ID: Theresa Raymond, female    DOB: Dec 12, 1970  Age: 53 y.o. MRN: [id number]  CC: Hypothyroidism and Hypertension   HPI Theresa Raymond presents for f/up ---  Discussed the use of AI scribe software for clinical note transcription with the patient, who gave verbal consent to proceed.  History of Present Illness Theresa Raymond is a 53 year old female who presents with left fifth finger pain following a fall two weeks ago.  Two weeks ago, she fell while walking her dogs in [place] after hitting some metal in the snow, resulting in an injury to her left fifth finger. The finger was initially very swollen and completely bruised. The pain is localized to the first joint of the pinky finger and persists, especially when bumped or when she tries to do too much with it. She has not sought medical attention for this injury yet and has been managing the pain with ice and Tylenol .  Her son-in-law, a publishing rights manager, advised her to monitor the finger for any misshaping or protrusion, which she has not observed. Despite the injury, she continues to engage in physical activities such as running, weightlifting twice a week, swimming once a week, and yoga, although she did not run initially due to the finger pain.  No symptoms of low heart rate, dizziness, lightheadedness, chest pain, or shortness of breath. She plans to receive a flu shot today. She recalls a previous note about prediabetes with an A1c of 5.7% and has since adjusted her diet by eliminating certain foods like English muffins and switching to plain yogurt.     Outpatient Medications Prior to Visit  Medication Sig Dispense Refill   EPINEPHrine  (EPIPEN  2-PAK) 0.3 mg/0.3 mL IJ SOAJ injection Inject 0.3 mg into the muscle as needed for anaphylaxis. 2 each 3   indapamide  (LOZOL ) 1.25 MG tablet TAKE 1 TABLET DAILY 90 tablet 3   Multiple Vitamins-Minerals (MULTIVITAMIN ADULT PO) Take 1 tablet by mouth daily.      UNITHROID  100 MCG tablet TAKE 1 TABLET DAILY BEFORE BREAKFAST 90 tablet 3   valsartan  (DIOVAN ) 160 MG tablet TAKE 1 TABLET DAILY 90 tablet 3   No facility-administered medications prior to visit.    ROS Review of Systems  Constitutional: Negative.  Negative for appetite change, chills, diaphoresis, fatigue and fever.  HENT: Negative.    Eyes: Negative.   Respiratory: Negative.  Negative for cough, chest tightness, shortness of breath and wheezing.   Cardiovascular:  Negative for chest pain, palpitations and leg swelling.  Gastrointestinal: Negative.  Negative for abdominal pain, blood in stool, constipation, diarrhea and vomiting.  Endocrine: Negative.   Genitourinary: Negative.  Negative for difficulty urinating.  Musculoskeletal: Negative.  Negative for arthralgias, joint swelling and myalgias.  Skin: Negative.   Neurological:  Negative for dizziness and weakness.  Hematological:  Negative for adenopathy. Does not bruise/bleed easily.  Psychiatric/Behavioral: Negative.      Objective:  BP 134/86 (BP Location: Left Arm, Patient Position: Sitting, Cuff Size: Normal)   Pulse (!) 55   Temp 97.7 F (36.5 C) (Oral)   Ht 5' 3 (1.6 m)   Wt 132 lb 9.6 oz (60.1 kg)   LMP 05/30/2024   SpO2 99%   BMI 23.49 kg/m   BP Readings from Last 3 Encounters:  06/03/24 134/86  12/01/23 128/80  05/03/23 136/82    Wt Readings from Last 3 Encounters:  06/03/24 132 lb 9.6 oz (60.1 kg)  12/01/23 137 lb 12.8  oz (62.5 kg)  05/03/23 137 lb (62.1 kg)    Physical Exam Vitals reviewed.  Constitutional:      Appearance: Normal appearance.  HENT:     Mouth/Throat:     Mouth: Mucous membranes are moist.  Eyes:     General: No scleral icterus.    Conjunctiva/sclera: Conjunctivae normal.  Cardiovascular:     Rate and Rhythm: Normal rate and regular rhythm.     Heart sounds: No murmur heard.    No friction rub. No gallop.  Pulmonary:     Effort: Pulmonary effort is normal.     Breath  sounds: No stridor. No wheezing, rhonchi or rales.  Abdominal:     General: Abdomen is flat.     Palpations: There is no mass.     Tenderness: There is no abdominal tenderness. There is no guarding.     Hernia: No hernia is present.  Musculoskeletal:        General: No swelling, tenderness or deformity. Normal range of motion.     Right hand: Normal.     Left hand: Normal. No swelling, deformity or bony tenderness. Normal range of motion.     Cervical back: Neck supple.     Right lower leg: No edema.     Left lower leg: No edema.     Comments: Left 5th finger ---  Normal flexion/extension. No ttp or swelling. Normal sensation and capillary refill.  Lymphadenopathy:     Cervical: No cervical adenopathy.  Skin:    General: Skin is warm and dry.     Coloration: Skin is not jaundiced.  Neurological:     General: No focal deficit present.     Mental Status: She is alert.  Psychiatric:        Mood and Affect: Mood normal.        Behavior: Behavior normal.     Lab Results  Component Value Date   WBC 4.3 06/03/2024   HGB 13.0 06/03/2024   HCT 37.3 06/03/2024   PLT 253.0 06/03/2024   GLUCOSE 108 (H) 06/03/2024   CHOL 211 (H) 12/01/2023   TRIG 48 12/01/2023   HDL 130 12/01/2023   LDLCALC 67 12/01/2023   ALT 12 12/01/2023   AST 22 12/01/2023   NA 137 06/03/2024   K 3.5 06/03/2024   CL 100 06/03/2024   CREATININE 0.68 06/03/2024   BUN 13 06/03/2024   CO2 29 06/03/2024   TSH 1.14 06/03/2024   HGBA1C 5.5 06/03/2024    MM 3D DIAGNOSTIC MAMMOGRAM UNILATERAL LEFT BREAST Result Date: 12/15/2023 CLINICAL DATA:  Screening recall for possible LEFT breast distortion. EXAM: DIGITAL DIAGNOSTIC UNILATERAL LEFT MAMMOGRAM WITH TOMOSYNTHESIS AND CAD; ULTRASOUND LEFT BREAST LIMITED TECHNIQUE: Left digital diagnostic mammography and breast tomosynthesis was performed. The images were evaluated with computer-aided detection. ; Targeted ultrasound examination of the left breast was performed.  COMPARISON:  Previous exam(s). ACR Breast Density Category b: There are scattered areas of fibroglandular density. FINDINGS: The previously described possible distortion in the upper-outer left breast posterior depth does not persist with additional views, consistent with superimposed fibroglandular tissue. No suspicious mass, microcalcification, or other finding is identified. Confirmatory targeted left breast ultrasound was performed from 1-3 o'clock. This demonstrates normal fibroglandular tissue. No suspicious solid or cystic mass or area of shadowing is identified. Targeted left axillary ultrasound demonstrates multiple morphologically benign lymph nodes. IMPRESSION: No evidence of malignancy in the LEFT breast. RECOMMENDATION: Return to routine screening mammography is recommended. The patient will be due  for screening in June 2026. I have discussed the findings and recommendations with the patient. If applicable, a reminder letter will be sent to the patient regarding the next appointment. BI-RADS CATEGORY  1: Negative. Electronically Signed   By: Dirk Arrant M.D.   On: 12/15/2023 14:14   US  LIMITED ULTRASOUND INCLUDING AXILLA LEFT BREAST  Result Date: 12/15/2023 CLINICAL DATA:  Screening recall for possible LEFT breast distortion. EXAM: DIGITAL DIAGNOSTIC UNILATERAL LEFT MAMMOGRAM WITH TOMOSYNTHESIS AND CAD; ULTRASOUND LEFT BREAST LIMITED TECHNIQUE: Left digital diagnostic mammography and breast tomosynthesis was performed. The images were evaluated with computer-aided detection. ; Targeted ultrasound examination of the left breast was performed. COMPARISON:  Previous exam(s). ACR Breast Density Category b: There are scattered areas of fibroglandular density. FINDINGS: The previously described possible distortion in the upper-outer left breast posterior depth does not persist with additional views, consistent with superimposed fibroglandular tissue. No suspicious mass, microcalcification, or other  finding is identified. Confirmatory targeted left breast ultrasound was performed from 1-3 o'clock. This demonstrates normal fibroglandular tissue. No suspicious solid or cystic mass or area of shadowing is identified. Targeted left axillary ultrasound demonstrates multiple morphologically benign lymph nodes. IMPRESSION: No evidence of malignancy in the LEFT breast. RECOMMENDATION: Return to routine screening mammography is recommended. The patient will be due for screening in June 2026. I have discussed the findings and recommendations with the patient. If applicable, a reminder letter will be sent to the patient regarding the next appointment. BI-RADS CATEGORY  1: Negative. Electronically Signed   By: Dirk Arrant M.D.   On: 12/15/2023 14:14   DG Finger Little Left Result Date: 06/03/2024 EXAM: 3 VIEW(S) XRAY OF THE FINGER(S) 06/03/2024 08:54:27 AM COMPARISON: None available. CLINICAL HISTORY: injury 2 weeks ago FINDINGS: BONES AND JOINTS: No acute fracture. No malalignment. Mild degenerative changes at the 5th DIP joint. SOFT TISSUES: The soft tissues are unremarkable. IMPRESSION: 1. No acute fracture or dislocation. 2. Mild degenerative changes at the 5th DIP joint. Electronically signed by: Waddell Calk MD 06/03/2024 09:01 AM EST RP Workstation: [id number]     Assessment & Plan:   Primary hypertension- Her BP is well controlled. Lytes and renal function are normal. -     Basic metabolic panel with GFR; Future -     CBC with Differential/Platelet; Future  Acquired hypothyroidism- She is euthyroid. -     CBC with Differential/Platelet; Future -     TSH; Future  Need for immunization against influenza -     Flu vaccine trivalent PF, 6mos and older(Flulaval ,Afluria,Fluarix,Fluzone)  Finger injury, left, initial encounter -     DG Finger Little Left  Chronic hyperglycemia -     Hemoglobin A1c; Future     Follow-up: Return in about 6 months (around 12/02/2024).  Debby Molt, MD

## 2024-06-03 NOTE — Patient Instructions (Signed)
 Finger Sprain, Adult A finger sprain is a tear or stretch in a ligament in a finger. Ligaments are tissues that connect bones to each other. What are the causes? Finger sprains happen when something makes the bones in the hand move in an abnormal way. They are often caused by a fall or an accident. What increases the risk? This condition is more likely to develop in people who: Participate in sports in which it is easy to fall, such as skiing. Play sports that involve catching an object, such as basketball. Have poor strength and flexibility. What are the signs or symptoms? Symptoms of this condition include: Pain or tenderness in the finger. Swelling in the finger. A bluish appearance to the finger. Bruising. Difficulty bending and flexing the finger. How is this diagnosed? This condition is diagnosed with an exam of your finger. Your health care provider may take an X-ray to see if any bones are broken or dislocated. How is this treated? Treatment for this condition depends on how severe the sprain is. It may involve: Preventing the finger from moving for a period of time. Your finger may be wrapped in a bandage (dressing) or splint, or your finger may be taped to the fingers beside it (buddy taping). Medicines for pain. Exercises to strengthen the finger. These may be recommended when the finger has healed. Surgery to reconnect the ligament to a bone. This may be done if the ligament was completely torn. Follow these instructions at home: If you have a removable splint: Wear the splint as told by your health care provider. Remove it only as told by your health care provider. Check the skin around the splint every day. Tell your health care provider about any concerns. Loosen the splint if your fingers tingle, become numb, or turn cold and blue. Keep the splint clean. If the splint is not waterproof: Do not let it get wet. Cover it with a watertight covering when you take a bath or  shower. Managing pain, stiffness, and swelling If directed, put ice on the injured area. To do this: If you have a removable splint, remove it as told by your health care provider. Put ice in a plastic bag. Place a towel between your skin and the bag. Leave the ice on for 20 minutes, 2-3 times a day. Remove the ice if your skin turns bright red. This is very important. If you cannot feel pain, heat, or cold, you have a greater risk of damage to the area. Move your fingers often to reduce stiffness and swelling. Raise (elevate) the injured area above the level of your heart while you are sitting or lying down. Medicines Take over-the-counter and prescription medicines only as told by your health care provider. Ask your health care provider if the medicine prescribed to you requires you to avoid driving or using machinery. General instructions Keep any dressings dry until your health care provider says they can be removed. If your fingers are buddy taped, replace your buddy taping as told by your health care provider. Do exercises as told by your health care provider or physical therapist. Do not wear rings on your injured finger. Keep all follow-up visits. This is important. Contact a health care provider if: Your pain is not controlled with medicine. Your bruising or swelling gets worse. Your splint is damaged. You develop a fever. Get help right away if: Your finger is numb or blue. Your finger feels colder to the touch than normal. Summary A finger sprain  is a tear or stretch in a ligament in a finger. Ligaments are tissues that connect bones to each other. Finger sprains happen when something makes the bones in the hand move in an abnormal way. They are often caused by a fall or accident. This condition is diagnosed with an exam of your finger. Your health care provider may do an X-ray to see if any bones are broken or dislocated. Treatment for this condition depends on how severe  the sprain is. Treatment may involve buddy taping or wearing a splint. Surgery to reconnect the ligament to a bone may be needed if the ligament was torn all the way. This information is not intended to replace advice given to you by your health care provider. Make sure you discuss any questions you have with your health care provider. Document Revised: 04/24/2023 Document Reviewed: 04/24/2023 [employer] Patient Education  2024 [employer].

## 2024-06-05 DIAGNOSIS — Z23 Encounter for immunization: Secondary | ICD-10-CM
